# Patient Record
Sex: Female | Born: 1945 | Race: White | Hispanic: No | Marital: Married | Smoking: Never smoker
Health system: Southern US, Community
[De-identification: ages and names within clinical notes are randomized; demographics above are authoritative.]

## PROBLEM LIST (undated history)

## (undated) DIAGNOSIS — I10 Essential (primary) hypertension: Secondary | ICD-10-CM

## (undated) DIAGNOSIS — E039 Hypothyroidism, unspecified: Secondary | ICD-10-CM

## (undated) DIAGNOSIS — Z87442 Personal history of urinary calculi: Secondary | ICD-10-CM

## (undated) DIAGNOSIS — K219 Gastro-esophageal reflux disease without esophagitis: Secondary | ICD-10-CM

## (undated) DIAGNOSIS — I509 Heart failure, unspecified: Secondary | ICD-10-CM

## (undated) DIAGNOSIS — E785 Hyperlipidemia, unspecified: Secondary | ICD-10-CM

## (undated) DIAGNOSIS — I5022 Chronic systolic (congestive) heart failure: Secondary | ICD-10-CM

## (undated) DIAGNOSIS — Z8489 Family history of other specified conditions: Secondary | ICD-10-CM

## (undated) DIAGNOSIS — R569 Unspecified convulsions: Secondary | ICD-10-CM

## (undated) DIAGNOSIS — R0600 Dyspnea, unspecified: Secondary | ICD-10-CM

## (undated) DIAGNOSIS — M1990 Unspecified osteoarthritis, unspecified site: Secondary | ICD-10-CM

## (undated) HISTORY — PX: OTHER SURGICAL HISTORY: SHX169

## (undated) HISTORY — PX: CARDIAC SURGERY: SHX584

## (undated) HISTORY — DX: Hypothyroidism, unspecified: E03.9

## (undated) HISTORY — DX: Essential (primary) hypertension: I10

## (undated) HISTORY — DX: Hyperlipidemia, unspecified: E78.5

## (undated) HISTORY — DX: Chronic systolic (congestive) heart failure: I50.22

## (undated) HISTORY — PX: CHOLECYSTECTOMY: SHX55

---

## 2006-04-17 ENCOUNTER — Ambulatory Visit: Payer: Self-pay | Admitting: Urology

## 2006-07-31 ENCOUNTER — Ambulatory Visit: Payer: Self-pay | Admitting: Urology

## 2006-08-02 ENCOUNTER — Ambulatory Visit: Payer: Self-pay | Admitting: Urology

## 2006-08-10 ENCOUNTER — Ambulatory Visit: Payer: Self-pay | Admitting: Urology

## 2006-11-19 ENCOUNTER — Ambulatory Visit: Payer: Self-pay | Admitting: Urology

## 2007-08-28 ENCOUNTER — Ambulatory Visit: Payer: Self-pay | Admitting: Urology

## 2008-04-24 IMAGING — CR DG ABDOMEN 1V
1 series · 1 of 1 positions shown · non-contrast
Comparison: none

REASON FOR EXAM: renal calculi-lithotripsy
COMMENTS:

[view not recorded]
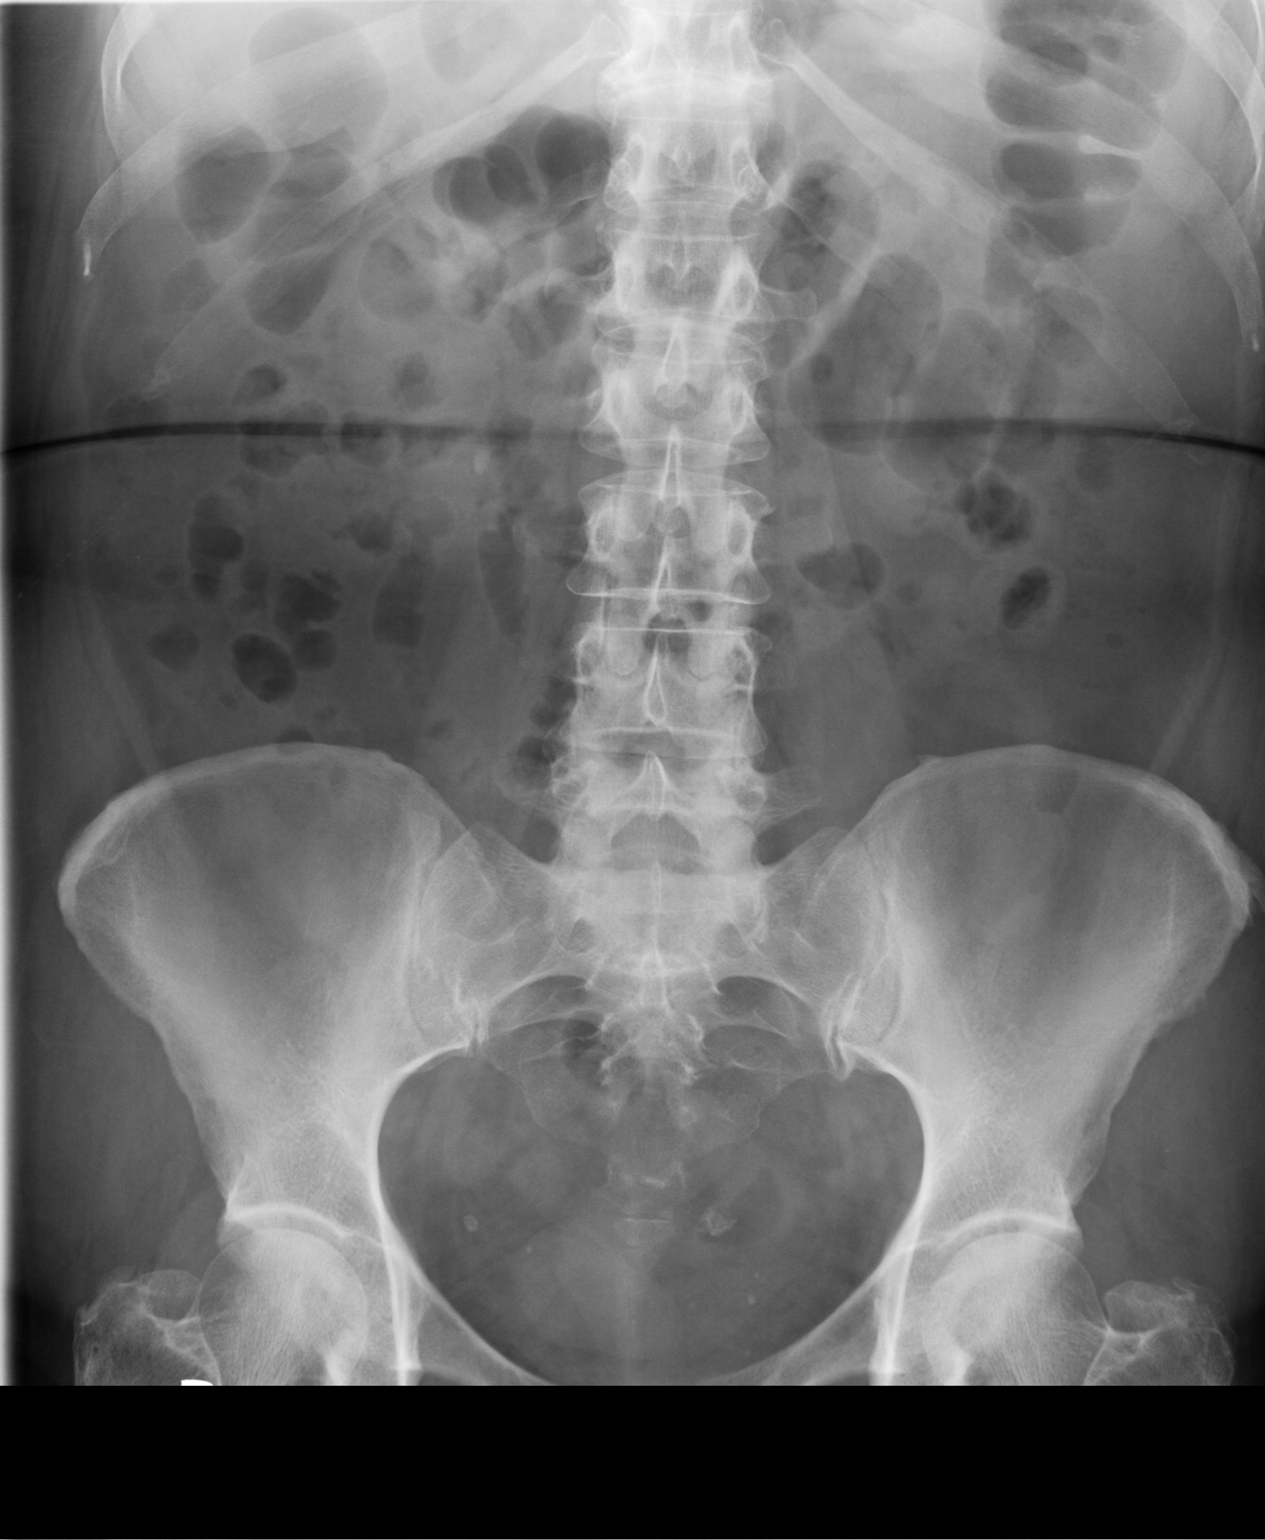

[1 of 1 positions shown; findings below may reference images not displayed]

PROCEDURE:     DXR - DXR KIDNEY URETER BLADDER  - August 02, 2006  [DATE]

RESULT:     Comparison is made to the study [DATE].

There is a calcification that projects in the right midabdomen between the
transverse processes of the second and third lumbar vertebra. This measures
approximately 8 mm in greatest dimension and is either a distal ureteral
stone or coarse phlebolith that is stable since yesterday's study just to
the LEFT of the coccyx. It measures approximately 8 mm in diameter.
IMPRESSION: See above.

## 2008-08-28 ENCOUNTER — Ambulatory Visit: Payer: Self-pay | Admitting: Urology

## 2009-09-01 ENCOUNTER — Ambulatory Visit: Payer: Self-pay | Admitting: Urology

## 2010-08-31 ENCOUNTER — Ambulatory Visit: Payer: Self-pay | Admitting: Urology

## 2012-10-04 ENCOUNTER — Other Ambulatory Visit (HOSPITAL_COMMUNITY): Payer: Self-pay | Admitting: *Deleted

## 2012-10-04 ENCOUNTER — Ambulatory Visit (HOSPITAL_COMMUNITY)
Admission: RE | Admit: 2012-10-04 | Discharge: 2012-10-04 | Disposition: A | Discharge status: Home / Self Care | Payer: Medicare Other | Source: Ambulatory Visit | Attending: Family Medicine | Admitting: Family Medicine

## 2012-10-04 DIAGNOSIS — R509 Fever, unspecified: Secondary | ICD-10-CM | POA: Insufficient documentation

## 2012-10-04 DIAGNOSIS — R059 Cough, unspecified: Secondary | ICD-10-CM

## 2012-10-04 DIAGNOSIS — R05 Cough: Secondary | ICD-10-CM

## 2012-11-19 ENCOUNTER — Ambulatory Visit: Payer: Self-pay | Admitting: Urology

## 2013-04-28 ENCOUNTER — Ambulatory Visit: Payer: Self-pay | Admitting: Gastroenterology

## 2013-04-29 LAB — PATHOLOGY REPORT

## 2013-05-01 ENCOUNTER — Observation Stay: Payer: Self-pay | Admitting: Internal Medicine

## 2013-05-01 LAB — CBC WITH DIFFERENTIAL/PLATELET
Basophil #: 0.1 10*3/uL (ref 0.0–0.1)
Basophil %: 1.2 %
Eosinophil #: 0.1 10*3/uL (ref 0.0–0.7)
Eosinophil %: 1.9 %
HCT: 38.6 % (ref 35.0–47.0)
HGB: 13.4 g/dL (ref 12.0–16.0)
Lymphocyte #: 1 10*3/uL (ref 1.0–3.6)
Lymphocyte %: 14.2 %
MCH: 34.6 pg — ABNORMAL HIGH (ref 26.0–34.0)
MCHC: 34.6 g/dL (ref 32.0–36.0)
MCV: 100 fL (ref 80–100)
Monocyte #: 0.7 x10 3/mm (ref 0.2–0.9)
Monocyte %: 11.1 %
Neutrophil #: 4.8 10*3/uL (ref 1.4–6.5)
Neutrophil %: 71.6 %
Platelet: 223 10*3/uL (ref 150–440)
RBC: 3.86 10*6/uL (ref 3.80–5.20)
RDW: 14.2 % (ref 11.5–14.5)
WBC: 6.7 10*3/uL (ref 3.6–11.0)

## 2013-05-01 LAB — COMPREHENSIVE METABOLIC PANEL
Albumin: 3.7 g/dL (ref 3.4–5.0)
Alkaline Phosphatase: 107 U/L (ref 50–136)
Anion Gap: 4 — ABNORMAL LOW (ref 7–16)
BUN: 18 mg/dL (ref 7–18)
Bilirubin,Total: 1.2 mg/dL — ABNORMAL HIGH (ref 0.2–1.0)
Calcium, Total: 9.5 mg/dL (ref 8.5–10.1)
Chloride: 107 mmol/L (ref 98–107)
Co2: 32 mmol/L (ref 21–32)
Creatinine: 0.91 mg/dL (ref 0.60–1.30)
EGFR (African American): >60
EGFR (Non-African Amer.): >60
Glucose: 128 mg/dL — ABNORMAL HIGH (ref 65–99)
Osmolality: 289 (ref 275–301)
Potassium: 4 mmol/L (ref 3.5–5.1)
SGOT(AST): 34 U/L (ref 15–37)
SGPT (ALT): 41 U/L (ref 12–78)
Sodium: 143 mmol/L (ref 136–145)
Total Protein: 6.7 g/dL (ref 6.4–8.2)

## 2013-05-01 LAB — PRO B NATRIURETIC PEPTIDE: B-Type Natriuretic Peptide: 6225 pg/mL — ABNORMAL HIGH (ref 0–125)

## 2013-05-01 LAB — TROPONIN I: Troponin-I: 0.05 ng/mL

## 2013-05-02 LAB — COMPREHENSIVE METABOLIC PANEL
Albumin: 3.6 g/dL (ref 3.4–5.0)
Alkaline Phosphatase: 99 U/L (ref 50–136)
Anion Gap: 5 — ABNORMAL LOW (ref 7–16)
BUN: 17 mg/dL (ref 7–18)
Bilirubin,Total: 1.1 mg/dL — ABNORMAL HIGH (ref 0.2–1.0)
Calcium, Total: 9.1 mg/dL (ref 8.5–10.1)
Chloride: 106 mmol/L (ref 98–107)
Co2: 33 mmol/L — ABNORMAL HIGH (ref 21–32)
Creatinine: 1.05 mg/dL (ref 0.60–1.30)
EGFR (African American): >60
EGFR (Non-African Amer.): 55 — ABNORMAL LOW
Glucose: 113 mg/dL — ABNORMAL HIGH (ref 65–99)
Osmolality: 289 (ref 275–301)
Potassium: 3.1 mmol/L — ABNORMAL LOW (ref 3.5–5.1)
SGOT(AST): 34 U/L (ref 15–37)
SGPT (ALT): 38 U/L (ref 12–78)
Sodium: 144 mmol/L (ref 136–145)
Total Protein: 6.3 g/dL — ABNORMAL LOW (ref 6.4–8.2)

## 2013-05-02 LAB — TSH: Thyroid Stimulating Horm: 4.46 u[IU]/mL

## 2013-05-02 LAB — TROPONIN I: Troponin-I: 0.05 ng/mL

## 2013-07-10 HISTORY — PX: PACEMAKER IMPLANT: EP1218

## 2013-08-13 ENCOUNTER — Ambulatory Visit: Payer: Self-pay | Admitting: Cardiology

## 2013-09-15 HISTORY — PX: MITRAL VALVE REPAIR: SHX2039

## 2013-10-22 ENCOUNTER — Encounter: Payer: Self-pay | Admitting: Cardiology

## 2013-11-07 ENCOUNTER — Encounter: Payer: Self-pay | Admitting: Cardiology

## 2013-12-08 ENCOUNTER — Encounter: Payer: Self-pay | Admitting: Cardiology

## 2014-01-29 ENCOUNTER — Ambulatory Visit: Payer: Self-pay | Admitting: Cardiovascular Disease

## 2014-01-29 LAB — URINALYSIS, COMPLETE
Bacteria: NONE SEEN
Bilirubin,UR: NEGATIVE
Blood: NEGATIVE
Glucose,UR: NEGATIVE mg/dL (ref 0–75)
Ketone: NEGATIVE
Nitrite: NEGATIVE
Ph: 5 (ref 4.5–8.0)
Protein: NEGATIVE
RBC,UR: 9 /HPF (ref 0–5)
Specific Gravity: 1.023 (ref 1.003–1.030)
Squamous Epithelial: 3
WBC UR: 2 /HPF (ref 0–5)

## 2014-01-29 LAB — BASIC METABOLIC PANEL
Anion Gap: 4 — ABNORMAL LOW (ref 7–16)
BUN: 17 mg/dL (ref 7–18)
Calcium, Total: 8.6 mg/dL (ref 8.5–10.1)
Chloride: 109 mmol/L — ABNORMAL HIGH (ref 98–107)
Co2: 30 mmol/L (ref 21–32)
Creatinine: 0.81 mg/dL (ref 0.60–1.30)
EGFR (African American): >60
EGFR (Non-African Amer.): >60
Glucose: 82 mg/dL (ref 65–99)
Osmolality: 286 (ref 275–301)
Potassium: 3.7 mmol/L (ref 3.5–5.1)
Sodium: 143 mmol/L (ref 136–145)

## 2014-01-29 LAB — CBC WITH DIFFERENTIAL/PLATELET
Basophil #: 0.1 10*3/uL (ref 0.0–0.1)
Basophil %: 1.7 %
Eosinophil #: 0.2 10*3/uL (ref 0.0–0.7)
Eosinophil %: 4.3 %
HCT: 39 % (ref 35.0–47.0)
HGB: 12.7 g/dL (ref 12.0–16.0)
Lymphocyte #: 1.1 10*3/uL (ref 1.0–3.6)
Lymphocyte %: 27 %
MCH: 32.1 pg (ref 26.0–34.0)
MCHC: 32.5 g/dL (ref 32.0–36.0)
MCV: 99 fL (ref 80–100)
Monocyte #: 0.5 x10 3/mm (ref 0.2–0.9)
Monocyte %: 12.1 %
Neutrophil #: 2.2 10*3/uL (ref 1.4–6.5)
Neutrophil %: 54.9 %
Platelet: 175 10*3/uL (ref 150–440)
RBC: 3.95 10*6/uL (ref 3.80–5.20)
RDW: 13.5 % (ref 11.5–14.5)
WBC: 4 10*3/uL (ref 3.6–11.0)

## 2014-01-29 LAB — APTT: Activated PTT: 28.7 secs (ref 23.6–35.9)

## 2014-01-29 LAB — PROTIME-INR
INR: 1
Prothrombin Time: 12.7 secs (ref 11.5–14.7)

## 2014-02-06 ENCOUNTER — Ambulatory Visit: Payer: Self-pay | Admitting: Cardiovascular Disease

## 2014-02-07 LAB — CBC WITH DIFFERENTIAL/PLATELET
Basophil #: 0 10*3/uL (ref 0.0–0.1)
Basophil %: 0.7 %
Eosinophil #: 0.2 10*3/uL (ref 0.0–0.7)
Eosinophil %: 4 %
HCT: 38.5 % (ref 35.0–47.0)
HGB: 12.9 g/dL (ref 12.0–16.0)
Lymphocyte #: 0.9 10*3/uL — ABNORMAL LOW (ref 1.0–3.6)
Lymphocyte %: 17.2 %
MCH: 32.8 pg (ref 26.0–34.0)
MCHC: 33.6 g/dL (ref 32.0–36.0)
MCV: 98 fL (ref 80–100)
Monocyte #: 0.6 x10 3/mm (ref 0.2–0.9)
Monocyte %: 11.5 %
Neutrophil #: 3.3 10*3/uL (ref 1.4–6.5)
Neutrophil %: 66.6 %
Platelet: 145 10*3/uL — ABNORMAL LOW (ref 150–440)
RBC: 3.93 10*6/uL (ref 3.80–5.20)
RDW: 13.5 % (ref 11.5–14.5)
WBC: 5 10*3/uL (ref 3.6–11.0)

## 2014-02-07 LAB — COMPREHENSIVE METABOLIC PANEL
Albumin: 3 g/dL — ABNORMAL LOW (ref 3.4–5.0)
Alkaline Phosphatase: 86 U/L
Anion Gap: 3 — ABNORMAL LOW (ref 7–16)
BUN: 14 mg/dL (ref 7–18)
Bilirubin,Total: 0.7 mg/dL (ref 0.2–1.0)
Calcium, Total: 8.8 mg/dL (ref 8.5–10.1)
Chloride: 106 mmol/L (ref 98–107)
Co2: 33 mmol/L — ABNORMAL HIGH (ref 21–32)
Creatinine: 0.89 mg/dL (ref 0.60–1.30)
EGFR (African American): >60
EGFR (Non-African Amer.): >60
Glucose: 91 mg/dL (ref 65–99)
Osmolality: 283 (ref 275–301)
Potassium: 4.3 mmol/L (ref 3.5–5.1)
SGOT(AST): 30 U/L (ref 15–37)
SGPT (ALT): 17 U/L
Sodium: 142 mmol/L (ref 136–145)
Total Protein: 6.2 g/dL — ABNORMAL LOW (ref 6.4–8.2)

## 2014-02-07 LAB — MAGNESIUM: Magnesium: 1.8 mg/dL

## 2014-10-30 NOTE — Discharge Summary (Signed)
PATIENT NAME:  Christy OctaveBREWER, Shatavia G MR#:  [id number] DATE OF BIRTH:  1946/01/05  DATE OF ADMISSION:  05/01/2013 DATE OF DISCHARGE:  05/02/2013  PRESENTING COMPLAINT: Shortness of breath and leg swelling.   DISCHARGE DIAGNOSES: 1.  Acute congestive heart failure, systolic, new onset.  2.  Hypertension.  3.  Obesity.   CONDITION ON DISCHARGE: Fair, vitals stable. Blood pressure is 117/84, pulse is 97, pulse ox is 95% on room air.   Echo Doppler showed EF of 20% to 25%, severely global decreased left ventricular systolic function, moderately increased left ventricular internal cavity size, mild to moderate tricuspid regurgitation, mild to moderate mitral valve regurgitation.   MEDICATIONS AT DISCHARGE: 1.  Allopurinol 100 mg p.o. daily.  2.  Aspirin 81 mg daily.  3.  Levoxyl 50 mcg p.o. daily.  4.  Lisinopril 30 mg daily.  5.  Lasix 20 mg 1 tablet once a day as needed for shortness of breath and leg swelling.  6.  Toprol-XL 50 mg daily.  7.  ProAir HFA 2 puffs 4 to 6 hourly as needed.   DIET: Low sodium diet.   FOLLOWUP:  1.  With Dr. Darrold JunkerParaschos in 2 to 4 weeks. The patient has an appointment for November 11th.   2.  Follow up with PCP, Dr. Ninfa LindenKathy Patterson.   LABORATORY AND RADIOLOGICAL DATA:   1.  Glucose is 113, BUN 17, creatinine 1.05, sodium is 144, potassium 3.1, chloride is 133.  2.  LFTs within normal limits. Albumin is 3.6.  3.  TSH is 4.46.  4.  Troponin is 0.05.  5.  Chest x-ray consistent with low-grade CHF.   6.  White count is 6.7.  7.  Troponin x 3 is negative. B-type natriuretic peptide is 6225.  8.  EKG sinus tach.   BRIEF SUMMARY OF HOSPITAL COURSE: Christy Stanton is a very pleasant 69 year old Caucasian female with past medical history of hypertension, hypothyroidism, comes in with progressive increasing shortness of breath along with orthopnea and lower extremity edema.  She had elevated BNP of more than 6000. She was admitted with:  1.  Acute congestive heart failure,  likely due to her long-standing history of hypertension. The patient has history of mitral regurgitation. She received 2 doses of Lasix and urine output was more than 1500 mL. She was clinically euvolemic. She was given Lasix to take it as needed at home. Echo showed EF of 20% to 25%. Cardiac enzymes remained negative. The patient has followup appointment with Dr. Darrold JunkerParaschos on November 11th which she is recommended to keep.   2.  Hyperglycemia. Sugars were stable.  3.  Hypertension. Continue lisinopril and Toprol.  4.  Hyperthyroidism. Synthroid was continued.  5.  Deep vein thrombosis prophylaxis with heparin.   The patient was advised lifestyle changes along with dietary restrictions of salt intake was recommended with the patient. Hospital stay otherwise remained stable. The patient remained a full code.   TIME SPENT: 40 minutes.   ____________________________ Wylie HailSona A. Allena KatzPatel, MD sap:cs D: 05/04/2013 09:51:52 ET T: 05/04/2013 20:44:23 ET JOB#: [id number]  cc: Meghna Hagmann A. Allena KatzPatel, MD, <Dictator> Marcina MillardAlexander Paraschos, MD Darcella GasmanKathy L. Jarold MottoPatterson, FNP-C Willow OraSONA A Kaysi Ourada MD ELECTRONICALLY SIGNED 05/15/2013 13:26

## 2014-10-30 NOTE — H&P (Signed)
PATIENT NAME:  Christy Stanton, BOTSFORD MR#:  [id number] DATE OF BIRTH:  1946-01-12  DATE OF ADMISSION:  05/01/2013  REFERRING PHYSICIAN: Dr. Corky Downs.   PRIMARY CARE PHYSICIAN: Dr. Sharlett Iles.   CHIEF COMPLAINT: Shortness of breath.   HISTORY OF PRESENT ILLNESS: Christy Stanton is a 69 year old Caucasian female with past medical history of hypertension and hypothyroidism and is presenting with shortness of breath. She originally had symptoms approximately 3 weeks ago which she described URI-like symptoms with nasal congestion, nasal purulence, and nonproductive cough. She was treated  with a course of azithromycin and her symptoms completely resolved; however, for the last 1 week, she has been experiencing gradually worsening dyspnea on exertion with associated 2- to 3-pillow orthopnea as well as a small amount of lower extremity edema.   She has also noticed a cough which is productive only in the a.m. of whitish sputum. The cough is persistent throughout the day, though most prominent in morning and night. Has no associated fevers, chills, sick contacts. She denies any chest pain or palpitations. She was evaluated by her PCP who felt that she had pneumonia, requested her to present to the Emergency Department.   Currently she is without complaint.   REVIEW OF SYSTEMS:  CONSTITUTIONAL: Denies any fevers, fatigue, weakness or pain.  EYES: Denies blurred vision, double vision, eye pain.  ENT: Denies ear pain, tinnitus or discharge.  RESPIRATORY: Cough as above. Dyspnea on exertion as above. Denies any wheezing or painful respirations.  CARDIOVASCULAR: Denies chest pain, palpitations. Positive for orthopnea, edema.  GASTROINTESTINAL: Denies nausea, vomiting, diarrhea, abdominal pain.  GENITOURINARY: Denies dysuria, hematuria.  ENDOCRINE: Denies nocturia or thyroid problems.  HEMOLYMPHATIC: Denies easy bruising or bleeding.  SKIN: Denies any rashes or lesions.  MUSCULOSKELETAL: Denies any pain in her neck,  back, shoulder knees, hip, or arthritic symptoms.  NEUROLOGIC: Denies any paralysis, paresthesias.  PSYCHIATRIC: Denies any anxiety or depressive symptoms.   Otherwise a full review of systems performed by me is negative.   PAST MEDICAL HISTORY: Hypertension, hypothyroidism, and nephrolithiasis.   FAMILY HISTORY: Multiple members of family with hyperlipidemia. Grandmother with hypertension. No coronary artery disease to her knowledge.   SOCIAL HISTORY: Denies any alcohol, tobacco or drug usage. Lives with her husband.   ALLERGIES: PENICILLIN.   HOME MEDICATIONS: Allopurinol 100 mg p.o. daily, aspirin 81 mg p.o. daily, Synthroid 50 mcg p.o. daily, lisinopril 30 mg p.o. daily, ProAir 90 mcg inhalation 2 puffs inhaled four to 6 hours as needed for shortness of breath, Toprol-XL 50 mg by mouth daily.   PHYSICAL EXAMINATION:  VITAL SIGNS: Temperature 98.6, heart rate 104, respirations 26, blood pressure 158/104, saturating 94% on room air. Weight 74.8 kg, BMI 30.2.  GENERAL: Well-nourished, obese Caucasian female who is currently in no acute distress.  HEAD: Normocephalic, atraumatic.  EYES: Pupils equal, reactive and round to light. Extraocular muscles are intact. No scleral icterus.  MOUTH: Moist mucosal membranes. Dentition intact. No abscess noted.  ENT: Throat clear without exudate. No external lesions.  NECK: Supple. No thyromegaly. No nodules appreciated. No JVD appreciated though somewhat difficult secondary to body habitus.  PULMONARY: Bibasilar crackles without wheezes or rhonchi. No use of accessory muscles. Good respiratory effort bilaterally. Diminished breath sounds at the right base.  CHEST: Nontender to palpation.  CARDIOVASCULAR: S1, S2, regular rate and rhythm, 2/6 systolic ejection murmur best heard at the left sternal border. She has trace pedal edema to the ankles bilaterally. Pedal pulses 2+ bilaterally.  GASTROINTESTINAL: Soft, nontender, nondistended, obese. No  masses.  No hepatosplenomegaly. Positive bowel sounds.  MUSCULOSKELETAL: No swelling, clubbing. Trace edema as mentioned above. Range of motion is full in all extremities.  NEUROLOGIC: Cranial nerves II through XII intact. No gross focal neurological deficits. Sensation intact. Reflexes intact.  SKIN: No ulcerations, lesions, rashes or cyanosis. Skin is warm and dry. Turgor is intact.  PSYCHIATRIC: Mood and affect are within normal limits. She is awake, alert and oriented x3. Insight and judgment are intact.   LABORATORY DATA: Sodium 143, potassium 4, chloride 107, bicarb 32, BUN 18, creatinine 0.91, glucose 128. BNP 6225. LFTs: Total protein 6.7, albumin 3.7, total bilirubin 1.2, alk  phos  107, AST 34, ALT 41. Troponin I 0.05. WBC 6.7, hemoglobin 13.4, platelets of 223.   EKG: Sinus tachycardia, heart rate of 102. No ST or T-wave abnormalities, though it meets minimal voltage criteria for likely left ventricular hypertrophy. Also has evidence of left atrial enlargement.   Chest x-ray revealing bilateral pleural effusions most prominent on the right.   ASSESSMENT AND PLAN: A 69 year old female with history of hypertension, hyperthyroidism presenting with shortness of breath which has been progressive over the last week with associated 3-pillow orthopnea and lower extremity edema.  1.  Acute congestive heart failure of unclear etiology. She does have a history of mitral regurgitation. Will order strict inputs and outputs, IV Lasix daily given she has already been given Lasix 40 mg once. Will check a TSH, transthoracic echocardiogram, and trend cardiac enzymes.  2.  Hyperbilirubinemia, slightly elevated without symptoms. Follow with repeat level.  3.  Hyperglycemia, insulin sliding scale q.6 hours.  4.  Hypertension. Continue lisinopril and Toprol.   5.  Hypothyroidism. Check TSH. Continue Synthroid.  6.  Deep vein thrombosis prophylaxis. Heparin subcutaneous.   CODE STATUS: FULL CODE.   TIME SPENT: 45  minutes.    ____________________________ Aaron Mose. Hower, MD dkh:np D: 05/01/2013 21:01:58 ET T: 05/01/2013 21:27:32 ET JOB#: [id number]  cc: Aaron Mose. Hower, MD, <Dictator> DAVID Woodfin Ganja MD ELECTRONICALLY SIGNED 05/02/2013 20:06

## 2014-10-31 NOTE — Op Note (Signed)
PATIENT NAME:  Christy Stanton, Christy Stanton MR#:  [id number] DATE OF BIRTH:  Mar 21, 1946  DATE OF PROCEDURE:  02/06/2014  Implanted cardioverter defibrillator implantation (Medtronic)-primary prevention.   PREPROCEDURE DIAGNOSES:  1. 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  2. History of atrioventricular block following mitral valve repair with subsequent resumption of atrioventricular conduction with a normal QRS duration and PR intervals.   POSTPROCEDURE DIAGNOSES: 1. 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  2. History of atrioventricular block following mitral valve repair with subsequent resumption of atrioventricular conduction with a normal QRS duration and PR intervals. 3. Status post left upper extremity venogram demonstrating central venous patency without focal stenosis or marked tortuosity to prevent lead placement.  4. Status post left-sided dual chamber single coil implantable cardioverter defibrillator implantation (Medtronic)-primary prevention.   PRIMARY OPERATOR: Brion Alimentonald D. Jae Skeet, M.D.   [facility] [place] [facility]   COMPLICATIONS:  None.   ESTIMATED BLOOD LOSS: Minimal.   SEDATION: Monitored anesthesia care.   LOCAL ANESTHETIC: 1% lidocaine 0.25% bupivacaine 30 mL.   FLUOROSCOPY TIME: Less than 5 minutes (see fluoroscopy report).   SEDATION MEDICATIONS: See anesthesia report.   INDICATIONS AND CONSENTS: Christy Stanton was evaluated in electrophysiology consultation clinic, following findings of a persistently depressed left ventricular ejection fraction despite symptomatic improvement status post mitral valve ring repair, on a stable and optimal medication regimen greater than 3 months, status post repair. LVEF equals 25%. Although the patient had AV block early after her procedure, she regained 1:1 AV conduction at baseline with PR and QRS intervals that were normal. When discussing primary prevention, ICD single-chamber versus dual-chamber ICD, decision to place a dual-chamber device based on history of AV block with mitral valve ring repair and risk of atrial arrhythmias in the setting of age equals 69 years old with history of severe mitral disease and nonischemic cardiomyopathy with congestive heart failure, EF equals 25%. Risks, benefits, alternatives were reviewed, and consent signed and placed on the chart in the clinic setting and scanned into the electronic medical record.   DESCRIPTION OF PROCEDURE: The patient was brought to the OR #2 in a fasting and nonsedated state. Timeout was performed. Appropriate resuscitative equipment was attached. The left infraclavicular region was prepped with chlorhexidine followed by ChloraPrep and draped usual sterile manner with Ioban on the surgical site.   An incision was made in the left infraclavicular region extending to the pre-pectoralis fascia. A subcutaneous pocket was fashioned inferiorly and medially to accommodate ICD hardware. A bacitracin-soaked radiopaque gauze was placed in the pocket. The central venous access was investigated using fluoroscopic landmarks alone; however,  secondary to low crossover point of the clavicle over the first rib, and the patient'Stanton elevated BMI, the decision was made to obtain a left upper extremity venogram with 10 mL of IV contrast cine'd  and used for a road map to guide IV access to the axillary vein as it crossed over the first rib. The J-tipped guidewire was passed below the level of the diaphragm further confirming venous position, and a second wire was placed using the same modified Seldinger technique with a J-tipped guidewire passed below the level of the diaphragm further confirming venous position.   Over the more inferior wire, a 9 [place] SafeSheath was passed. Dilator wire removed. The body sheath was aspirated and flushed and then used to deliver the Medtronic model 6935M lead to the inferior right atrium using a curved purple stylet. The tricuspid valve was crossed and the lead was passed into the right ventricular outflow tract. The stylet was exchanged for a gray stylet with a slight curve at the tip. The lead was withdrawn and advanced towards the apex with counterclockwise torque directing the lead tip towards the interventricular septum at the level of the apex. Fixation screw was deployed, adequate lead slack was applied and confirmed with deep inspiration. There was no stimulation of the diaphragm with 10 V of output. R waves were measured at 10.9 mV with slew rate 2.7 V/sec, impedance 935 ohms, threshold 0.7 V at 0.5 ms.   The lead was fixed to the pre-pectoralis fascia with 2 separate 0 silk ties and tested with gentle traction to confirm stability. Over the superior J-tip guidewire, a 7 [place] SafeSheath was advanced. Dilator wire removed, and the body sheath was aspirated and flushed, and then used to deliver the Medtronic model 5076 lead to the inferior right atrium using a blue J-stylet. The lead tip was positioned in the region of the anterolateral right atrium with fixation screw deployed. Adequate lead slack applied and  confirmed with deep inspiration. There was no stimulation of the diaphragm with 10 V of output. P waves were measured at 2.2 mV with slew rate 1.1 V/sec, impedance of 506 ohms, and threshold of 0.9 V at 0.5 ms. The lead was fixed to the pre-pectoralis fascia with 2 separate 0 silk ties and tested with gentle traction to confirm stability.   The bacitracin-soaked radiopaque gauze was removed from the pocket. The pocket was flushed with copious amounts of bacitracin. Hemostasis was good. The leads were connected to the ICD generator, Medtronic Evera XT DR with visual confirmation of pins completely through the pin block, set screws tightened, leads tested with gentle traction to confirm stability and reconfirmation of pins completely through the pin block. The leads were then carefully coiled around the posterior aspect of the device and placed in the pocket with the header facing medially. The incision was closed with running layers of 2-0 and 3-0 Vicryl followed by 4-0 V-lock. Steri-Strips and a sterile dressing were applied. A pressure dressing was applied.   SUMMARY OF IMPLANTED HARDWARE: ICD generator is a Medtronic Evera XT DR, model K1472076, serial number [id number] with right ventricular lead model 6935M-55 cm, serial number [id number], and right atrial lead Medtronic model 5076-45 cm, serial number [id number].   SUMMARY OF PROGRAM PARAMETERS: Tachy therapy is programmed as a single zone device detecting ventricular fibrillation for rates greater than 200 beats per minute, 30 out of 40 for detection treated with ATP during charging followed by 35 joules shocks x 6. Brady pacing is AAI with DDD back-up, lower rate of 60, maximum tracking rate equals 120.   NOTATION: The patient had AV Wenckebach when paced under sedation at 90 beats per minute. With increasing this rate to 100 beats  per minute, there was 2:1 AV conduction. For this reason, rate response was not enabled on this device to avoid creating  long shorts with managed ventricular pacing and/or avoid any unnecessary ventricular pacing.   RECOMMENDATIONS: Bedrest x 4 hours. Telemetry overnight. Chest x-ray, PA and lateral in the morning. Pending wound check in the a.m. Christy Stanton will be discharged home with request to follow up with primary care physician for a wound check in 2 weeks, and follow with Dr. Darrold Junker for device follow-up q.3-4 months thereafter. Both Dr. Darrold Junker and Christy Stanton have our office contact information, if we should be of any assistance in the future.   ____________________________ Brion Aliment Ramces Shomaker, MD ddh:jr D: 02/06/2014 15:06:06 ET T: 02/06/2014 16:42:31 ET JOB#: [id number]  cc: Brion Aliment. Denetria Luevanos, MD, <Dictator> Marcina Millard, MD [facility] Verne Grain MD ELECTRONICALLY SIGNED 02/20/2014 15:07

## 2014-10-31 NOTE — Discharge Summary (Signed)
PATIENT NAME:  Christy OxfordBREWER, Vianca S MR#:  [id number] DATE OF BIRTH:  [birth year]  DATE OF ADMISSION:  02/06/2014 DATE OF DISCHARGE:  02/07/2014  DISCHARGE DIAGNOSES: 1.  Severe dilated cardiomyopathy.  2.  Hypertension.  3.  Hyperlipidemia.  4.  Coronary artery disease.   PROCEDURES: Patient underwent a defibrillator placement without complication as note is seen.   HISTORY OF PRESENT ILLNESS: This is a 69 year old female with known severe dilated cardiomyopathy on appropriate medications with congestive heart failure, stable at this time, who received a defibrillator placement without complication. The patient had done fairly well and was ambulating well without significant symptoms and was discharged to home in good condition.   DISCHARGE MEDICATIONS: Medication management includes allopurinol 100 mg each day, carvedilol 6.25 mg b.i.d., levothyroxine 0.05 mg each day, losartan 25 mg each day, nitrofurantoin 50 mg each evening, and simvastatin 20 mg each day.   DISCHARGE INSTRUCTIONS:  She is to follow up in 2 weeks for further evaluation and treatment options.    ____________________________ Lamar BlinksBruce J. Marlena Barbato, MD bjk:ts D: 02/07/2014 07:33:53 ET T: 02/07/2014 11:08:24 ET JOB#: [id number]  cc: Lamar BlinksBruce J. Jalaiya Oyster, MD, <Dictator> Lamar BlinksBRUCE J Wasil Wolke MD ELECTRONICALLY SIGNED 02/18/2014 10:16

## 2015-10-22 IMAGING — CR DG CHEST 2V
1 series · 2 of 2 positions shown · non-contrast
Comparison: 05/01/2013

CLINICAL DATA: Preop

EXAM:
CHEST  2 VIEW

[Series 1: w chest pa · 0.14mm/px · 2 of 2 slices shown]
[im 1/2]
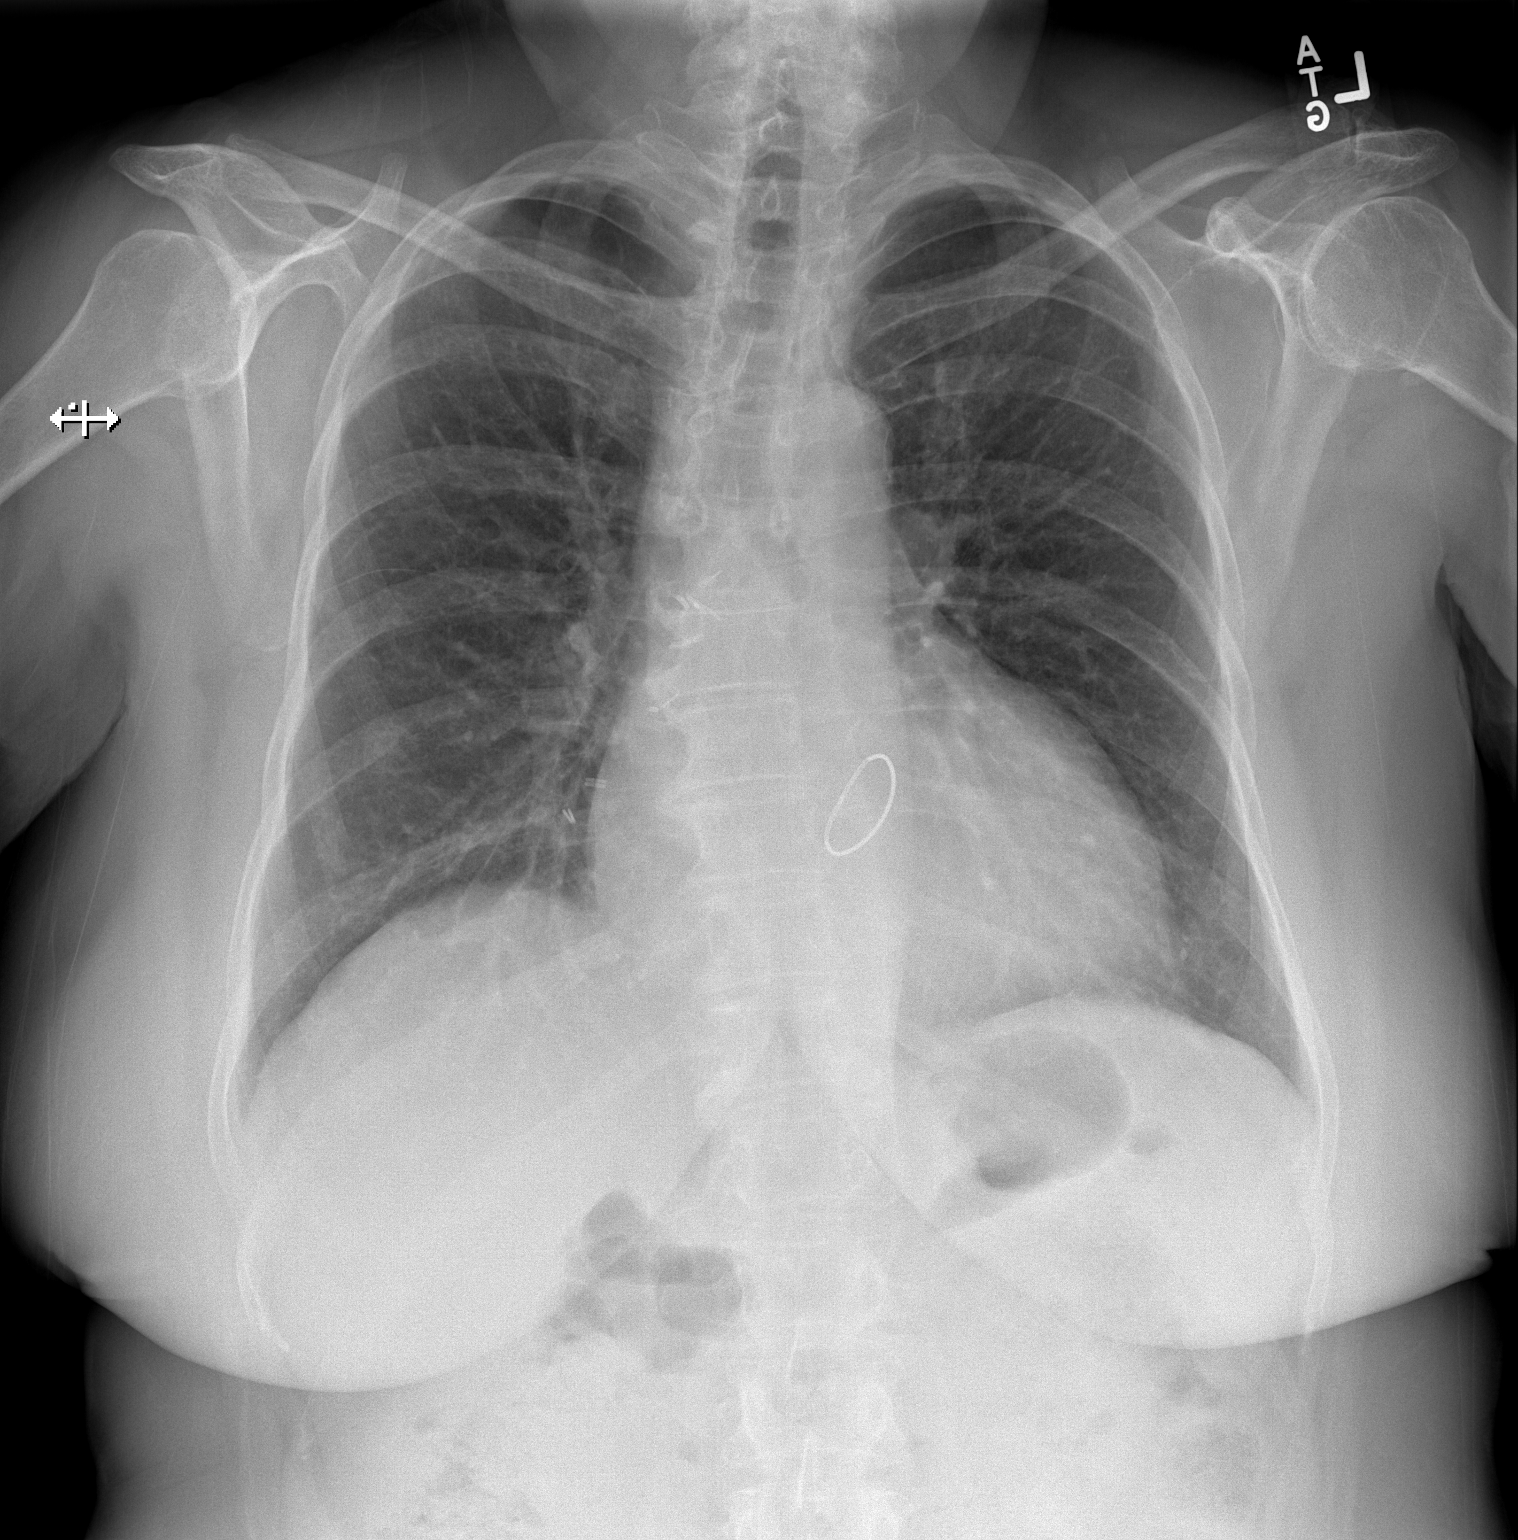
[im 2/2]
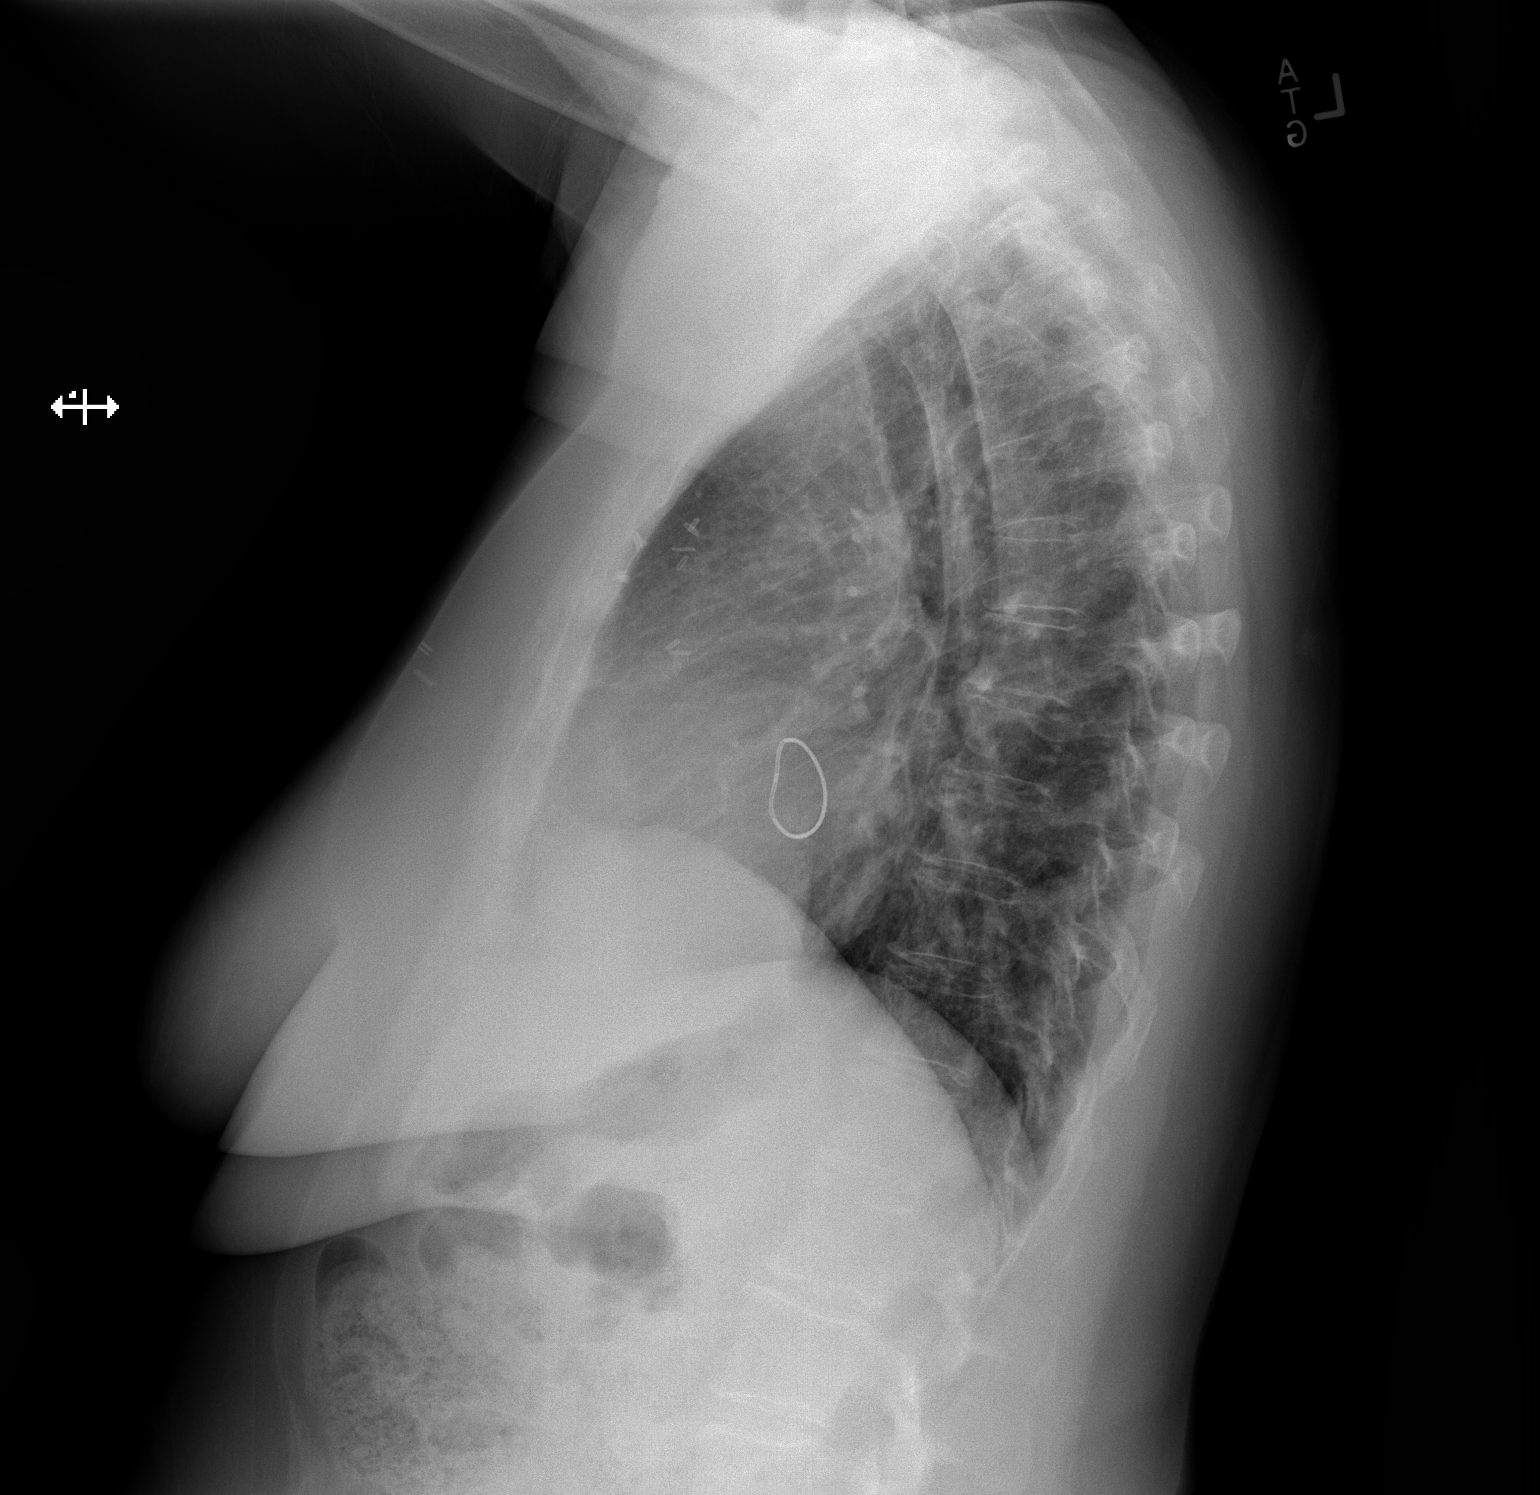

[2 of 2 positions shown; findings below may reference images not displayed]

FINDINGS: Cardiomediastinal silhouette is stable. Cardiomegaly is noted. The
patient is status post cardiac valve replacement. Mild degenerative
changes thoracic spine. Mild thoracic dextroscoliosis. No acute
infiltrate or pulmonary edema.
IMPRESSION: No active disease.  Cardiomegaly.

## 2019-05-20 ENCOUNTER — Ambulatory Visit: Payer: Self-pay | Admitting: Urology

## 2019-06-04 ENCOUNTER — Ambulatory Visit: Payer: Self-pay | Admitting: Urology

## 2019-06-18 ENCOUNTER — Other Ambulatory Visit: Payer: Self-pay

## 2019-06-18 ENCOUNTER — Ambulatory Visit: Payer: Medicare Other | Admitting: Urology

## 2019-06-18 ENCOUNTER — Encounter: Payer: Self-pay | Admitting: Urology

## 2019-06-18 VITALS — BP 150/78 | HR 72 | Ht 62.0 in | Wt 174.0 lb

## 2019-06-18 DIAGNOSIS — Z87442 Personal history of urinary calculi: Secondary | ICD-10-CM

## 2019-06-18 NOTE — Progress Notes (Signed)
06/18/2019 5:40 PM   Christy Stanton [birth year] [id number]  Referring provider: No referring provider defined for this encounter.  Chief Complaint  Patient presents with  . Other    HPI: 73 year old female with a personal history of nephrolithiasis who presents today to establish care.  She is previously followed by Dr. Jacqlyn Larsen.  She has a personal history of uric acid stones.  She is being prescribed allopurinol by Dr. Jacqlyn Larsen.  She previously declined 24 hour ruine.    Most recent imaging in the form of KUB 2014 shows no stone burden.    No recent flank pain.    PMH: Past Medical History:  Diagnosis Date  . Chronic systolic heart failure ([place])   . Hyperlipidemia   . Hypertension   . Hypothyroidism     Surgical History: Past Surgical History:  Procedure Laterality Date  . CARDIAC SURGERY    . MITRAL VALVE REPAIR    . TRANSESOPHAGEAL ECHOCARDIOGRAPHY      Home Medications:  Allergies as of 06/18/2019      Reactions   Erythromycin Base Nausea And Vomiting   Other reaction(s): OTHER Other reaction(s): Unknown Other reaction(s): OTHER Other reaction(s): OTHER Pt states she can take this medication, but causes severe GI upset   Penicillins Nausea And Vomiting, Other (See Comments)   Other reaction(s): RASH Childhood allergy. Skin reaction. No anaphylaxis. Other reaction(s): RASH Other reaction(s): RASH Childhood allergy. Skin reaction. No anaphylaxis. Other reaction(s): RASH Childhood allergy. Skin reaction. No anaphylaxis. Other reaction(s): RASH      Medication List       Accurate as of June 18, 2019 11:59 PM. If you have any questions, ask your nurse or doctor.        allopurinol 100 MG tablet Commonly known as: ZYLOPRIM Take by mouth.   aspirin EC 81 MG tablet Take by mouth.   carvedilol 12.5 MG tablet Commonly known as: COREG Take 12.5 mg by mouth 2 (two) times daily.   furosemide 20 MG tablet Commonly known as: LASIX once daily as  needed.   levothyroxine 50 MCG tablet Commonly known as: SYNTHROID Take by mouth.   lisinopril 20 MG tablet Commonly known as: ZESTRIL Take by mouth.   losartan 25 MG tablet Commonly known as: COZAAR Take 25 mg by mouth daily.   metoprolol tartrate 25 MG tablet Commonly known as: LOPRESSOR Take by mouth.   Omega-3 1000 MG Caps Take by mouth.   potassium chloride 10 MEQ tablet Commonly known as: KLOR-CON Take by mouth.   simvastatin 20 MG tablet Commonly known as: ZOCOR TAKE 1 TABLET BY MOUTH AT BEDTIME FOR CHOLESTEROL       Allergies:  Allergies  Allergen Reactions  . Erythromycin Base Nausea And Vomiting    Other reaction(s): OTHER Other reaction(s): Unknown Other reaction(s): OTHER Other reaction(s): OTHER Pt states she can take this medication, but causes severe GI upset   . Penicillins Nausea And Vomiting and Other (See Comments)    Other reaction(s): RASH Childhood allergy. Skin reaction. No anaphylaxis. Other reaction(s): RASH Other reaction(s): RASH Childhood allergy. Skin reaction. No anaphylaxis. Other reaction(s): RASH Childhood allergy. Skin reaction. No anaphylaxis. Other reaction(s): RASH     Family History: History reviewed. No pertinent family history.  Social History:  reports that she has never smoked. She has never used smokeless tobacco. She reports that she does not drink alcohol. No history on file for drug.  ROS: UROLOGY Frequent Urination?: No Hard to postpone urination?: Yes Burning/pain with urination?:  No Get up at night to urinate?: No Leakage of urine?: Yes Urine stream starts and stops?: No Trouble starting stream?: No Do you have to strain to urinate?: No Blood in urine?: No Urinary tract infection?: No Sexually transmitted disease?: No Injury to kidneys or bladder?: No Painful intercourse?: No Weak stream?: No Currently pregnant?: No Vaginal bleeding?: No Last menstrual period?: n  Gastrointestinal Nausea?:  No Vomiting?: No Indigestion/heartburn?: No Diarrhea?: No Constipation?: No  Constitutional Fever: No Night sweats?: No Weight loss?: No Fatigue?: No  Skin Skin rash/lesions?: No Itching?: No  Eyes Blurred vision?: No Double vision?: No  Ears/Nose/Throat Sore throat?: No Sinus problems?: No  Hematologic/Lymphatic Swollen glands?: No Easy bruising?: No  Cardiovascular Leg swelling?: No Chest pain?: No  Respiratory Cough?: No Shortness of breath?: No  Endocrine Excessive thirst?: No  Musculoskeletal Back pain?: No Joint pain?: No  Neurological Headaches?: No Dizziness?: No  Psychologic Depression?: No Anxiety?: No  Physical Exam: BP (!) 150/78   Pulse 72   Ht 5\' 2"  (1.575 m)   Wt 174 lb (78.9 kg)   BMI 31.83 kg/m   Constitutional:  Alert and oriented, No acute distress. HEENT: [place] AT, moist mucus membranes.  Trachea midline, no masses. Cardiovascular: No clubbing, cyanosis, or edema. Respiratory: Normal respiratory effort, no increased work of breathing. GI: Abdomen is soft, nontender, nondistended, no abdominal masses GU: No CVA tenderness Skin: No rashes, bruises or suspicious lesions. Neurologic: Grossly intact, no focal deficits, moving all 4 extremities. Psychiatric: Normal mood and affect.  Laboratory Data: Lab Results  Component Value Date   WBC 5.0 02/07/2014   HGB 12.9 02/07/2014   HCT 38.5 02/07/2014   MCV 98 02/07/2014   PLT 145 (L) 02/07/2014    Lab Results  Component Value Date   CREATININE 0.89 02/07/2014     Pertinent Imaging: No new imaging  Assessment & Plan:    1. History of kidney stones Currently completely asymptomatic without any interval stone episodes in many years  Last imaging in 2015 without significant stone burden  At this point in time, I see no need for further imaging unless she becomes symptomatic.  She primarily presents today to have a established urologist in case she has issues in the  future.  All questions were answered today.  Continue encourage adequate hydration as tolerated.  F/u prn  2016, MD  [facility] [address] [place], [place] [phone]

## 2019-11-27 ENCOUNTER — Encounter: Payer: Self-pay | Admitting: Urology

## 2020-09-13 ENCOUNTER — Emergency Department: Payer: Medicare PPO

## 2020-09-13 ENCOUNTER — Other Ambulatory Visit: Payer: Self-pay

## 2020-09-13 ENCOUNTER — Other Ambulatory Visit
Admission: RE | Admit: 2020-09-13 | Discharge: 2020-09-13 | Disposition: A | Discharge status: Home / Self Care | Payer: Medicare PPO | Source: Ambulatory Visit | Attending: *Deleted | Admitting: *Deleted

## 2020-09-13 ENCOUNTER — Encounter: Payer: Self-pay | Admitting: Emergency Medicine

## 2020-09-13 ENCOUNTER — Emergency Department
Admission: EM | Admit: 2020-09-13 | Discharge: 2020-09-13 | Disposition: A | Discharge status: Home / Self Care | Payer: Medicare PPO | Attending: Student in an Organized Health Care Education/Training Program | Admitting: Student in an Organized Health Care Education/Training Program

## 2020-09-13 DIAGNOSIS — R197 Diarrhea, unspecified: Secondary | ICD-10-CM | POA: Diagnosis not present

## 2020-09-13 DIAGNOSIS — R1011 Right upper quadrant pain: Secondary | ICD-10-CM | POA: Insufficient documentation

## 2020-09-13 DIAGNOSIS — Z79899 Other long term (current) drug therapy: Secondary | ICD-10-CM | POA: Insufficient documentation

## 2020-09-13 DIAGNOSIS — K8020 Calculus of gallbladder without cholecystitis without obstruction: Secondary | ICD-10-CM | POA: Diagnosis not present

## 2020-09-13 DIAGNOSIS — E039 Hypothyroidism, unspecified: Secondary | ICD-10-CM | POA: Insufficient documentation

## 2020-09-13 DIAGNOSIS — Z7982 Long term (current) use of aspirin: Secondary | ICD-10-CM | POA: Insufficient documentation

## 2020-09-13 DIAGNOSIS — I5022 Chronic systolic (congestive) heart failure: Secondary | ICD-10-CM | POA: Insufficient documentation

## 2020-09-13 DIAGNOSIS — I110 Hypertensive heart disease with heart failure: Secondary | ICD-10-CM | POA: Insufficient documentation

## 2020-09-13 LAB — COMPREHENSIVE METABOLIC PANEL
ALT: 20 U/L (ref 0–44)
AST: 27 U/L (ref 15–41)
Albumin: 3.9 g/dL (ref 3.5–5.0)
Alkaline Phosphatase: 74 U/L (ref 38–126)
Anion gap: 13 (ref 5–15)
BUN: 20 mg/dL (ref 8–23)
CO2: 26 mmol/L (ref 22–32)
Calcium: 9.4 mg/dL (ref 8.9–10.3)
Chloride: 102 mmol/L (ref 98–111)
Creatinine, Ser: 0.9 mg/dL (ref 0.44–1.00)
GFR, Estimated: >60 mL/min (ref >60)
Glucose, Bld: 79 mg/dL (ref 70–99)
Potassium: 4.1 mmol/L (ref 3.5–5.1)
Sodium: 141 mmol/L (ref 135–145)
Total Bilirubin: 1.6 mg/dL — ABNORMAL HIGH (ref 0.3–1.2)
Total Protein: 7.1 g/dL (ref 6.5–8.1)

## 2020-09-13 LAB — CBC
HCT: 41.1 % (ref 36.0–46.0)
Hemoglobin: 14.3 g/dL (ref 12.0–15.0)
MCH: 33.7 pg (ref 26.0–34.0)
MCHC: 34.8 g/dL (ref 30.0–36.0)
MCV: 96.9 fL (ref 80.0–100.0)
Platelets: 191 10*3/uL (ref 150–400)
RBC: 4.24 MIL/uL (ref 3.87–5.11)
RDW: 11.9 % (ref 11.5–15.5)
WBC: 6.5 10*3/uL (ref 4.0–10.5)
nRBC: 0 % (ref 0.0–0.2)

## 2020-09-13 LAB — TROPONIN I (HIGH SENSITIVITY)
Troponin I (High Sensitivity): 18 ng/L — ABNORMAL HIGH (ref <18)
Troponin I (High Sensitivity): 17 ng/L (ref <18)

## 2020-09-13 LAB — LIPASE, BLOOD: Lipase: 34 U/L (ref 11–51)

## 2020-09-13 MED ORDER — NITROFURANTOIN MONOHYD MACRO 100 MG PO CAPS: 100.0000 mg | ORAL_CAPSULE | Freq: Two times a day (BID) | ORAL | 0 refills | Status: DC

## 2020-09-13 NOTE — ED Triage Notes (Signed)
C/O RUQ abdominal pain, N/V/D over the weekend.  Seen through [facility] walk-in and was sent to ED for evaluation for troponin of 18.  AAOx3.  Skin warm and dry.  Only complaint is for slight discomfort to RUQ abdomen.

## 2020-09-13 NOTE — ED Provider Notes (Addendum)
[facility] Emergency Department Provider Note    Event Date/Time   First MD Initiated Contact with Patient 09/13/20 1523     (approximate)  I have reviewed the triage vital signs and the nursing notes.   HISTORY  Chief Complaint Abnormal Lab    HPI Christy Stanton is a 75 y.o. female   with the below listed past medical history presents to the ER for evaluation of right upper quadrant pain.  States that she had nausea vomiting diarrhea over the weekend.  Denies any shortness of breath or cough.  Has had some chills but no measured fevers.  No recent antibiotics.  Denies any pain at this time.  Does still have her gallbladder.   Past Medical History:  Diagnosis Date  . Chronic systolic heart failure ([facility])   . Hyperlipidemia   . Hypertension   . Hypothyroidism    No family history on file. Past Surgical History:  Procedure Laterality Date  . CARDIAC SURGERY    . MITRAL VALVE REPAIR    . TRANSESOPHAGEAL ECHOCARDIOGRAPHY     There are no problems to display for this patient.     Prior to Admission medications   Medication Sig Start Date End Date Taking? Authorizing Provider  nitrofurantoin, macrocrystal-monohydrate, (MACROBID) 100 MG capsule Take 1 capsule (100 mg total) by mouth 2 (two) times daily for 3 days. 09/13/20 09/16/20 Yes Willy Eddy, MD  allopurinol (ZYLOPRIM) 100 MG tablet Take by mouth. 07/17/13   [provider]  aspirin EC 81 MG tablet Take by mouth. 08/21/12   [provider]  carvedilol (COREG) 12.5 MG tablet Take 12.5 mg by mouth 2 (two) times daily. 06/11/19   [provider]  furosemide (LASIX) 20 MG tablet once daily as needed. 01/27/14   [provider]  levothyroxine (SYNTHROID) 50 MCG tablet Take by mouth. 08/21/12   [provider]  lisinopril (ZESTRIL) 20 MG tablet Take by mouth.    [provider]  losartan (COZAAR) 25 MG tablet Take 25 mg by mouth daily. 03/19/19    [provider]  metoprolol tartrate (LOPRESSOR) 25 MG tablet Take by mouth.    [provider]  Omega-3 1000 MG CAPS Take by mouth.    [provider]  potassium chloride (KLOR-CON) 10 MEQ tablet Take by mouth. 11/15/17   [provider]  simvastatin (ZOCOR) 20 MG tablet TAKE 1 TABLET BY MOUTH AT BEDTIME FOR CHOLESTEROL 11/28/13   [provider]    Allergies Erythromycin base and Penicillins    Social History Social History   Tobacco Use  . Smoking status: Never Smoker  . Smokeless tobacco: Never Used  Substance Use Topics  . Alcohol use: Never    Review of Systems Patient denies headaches, rhinorrhea, blurry vision, numbness, shortness of breath, chest pain, edema, cough, abdominal pain, nausea, vomiting, diarrhea, dysuria, fevers, rashes or hallucinations unless otherwise stated above in HPI. ____________________________________________   PHYSICAL EXAM:  VITAL SIGNS: Vitals:   09/13/20 1240  BP: 133/83  Pulse: 98  Resp: 16  Temp: 98.5 F (36.9 C)  SpO2: 98%    Constitutional: Alert and oriented.  Eyes: Conjunctivae are normal.  Head: Atraumatic. Nose: No congestion/rhinnorhea. Mouth/Throat: Mucous membranes are moist.   Neck: No stridor. Painless ROM.  Cardiovascular: Normal rate, regular rhythm. Grossly normal heart sounds.  Good peripheral circulation. Respiratory: Normal respiratory effort.  No retractions. Lungs CTAB. Gastrointestinal: Soft with mild ttp in RUQ, no rebound or guarding. No  distention. No abdominal bruits. No CVA tenderness. Genitourinary:  Musculoskeletal: No lower extremity tenderness nor edema.  No joint effusions. Neurologic:  Normal speech and language. No gross focal neurologic deficits are appreciated. No facial droop Skin:  Skin is warm, dry and intact. No rash noted. Psychiatric: Mood and affect are normal. Speech and behavior are normal.  ____________________________________________    LABS (all labs ordered are listed, but only abnormal results are displayed)  Results for orders placed or performed during the hospital encounter of 09/13/20 (from the past 24 hour(s))  Lipase, blood     Status: None   Collection Time: 09/13/20 12:46 PM  Result Value Ref Range   Lipase 34 11 - 51 U/L  Comprehensive metabolic panel     Status: Abnormal   Collection Time: 09/13/20 12:46 PM  Result Value Ref Range   Sodium 141 135 - 145 mmol/L   Potassium 4.1 3.5 - 5.1 mmol/L   Chloride 102 98 - 111 mmol/L   CO2 26 22 - 32 mmol/L   Glucose, Bld 79 70 - 99 mg/dL   BUN 20 8 - 23 mg/dL   Creatinine, Ser 9.48 0.44 - 1.00 mg/dL   Calcium 9.4 8.9 - 54.6 mg/dL   Total Protein 7.1 6.5 - 8.1 g/dL   Albumin 3.9 3.5 - 5.0 g/dL   AST 27 15 - 41 U/L   ALT 20 0 - 44 U/L   Alkaline Phosphatase 74 38 - 126 U/L   Total Bilirubin 1.6 (H) 0.3 - 1.2 mg/dL   GFR, Estimated >27 >03 mL/min   Anion gap 13 5 - 15  CBC     Status: None   Collection Time: 09/13/20 12:46 PM  Result Value Ref Range   WBC 6.5 4.0 - 10.5 K/uL   RBC 4.24 3.87 - 5.11 MIL/uL   Hemoglobin 14.3 12.0 - 15.0 g/dL   HCT 50.0 93.8 - 18.2 %   MCV 96.9 80.0 - 100.0 fL   MCH 33.7 26.0 - 34.0 pg   MCHC 34.8 30.0 - 36.0 g/dL   RDW 99.3 71.6 - 96.7 %   Platelets 191 150 - 400 K/uL   nRBC 0.0 0.0 - 0.2 %  Troponin I (High Sensitivity)     Status: None   Collection Time: 09/13/20 12:46 PM  Result Value Ref Range   Troponin I (High Sensitivity) 17 <18 ng/L   ____________________________________________  EKG My review and personal interpretation at Time: 15:43   Indication: ruq pain  Rate: 90  Rhythm: sinus Axis: normal Other: nonspecific st abn, unchanged from previous ekg 11:38 ____________________________________________  RADIOLOGY  I personally reviewed all radiographic images ordered to evaluate for the above acute complaints and reviewed radiology reports and findings.  These findings were personally discussed with the  patient.  Please see medical record for radiology report.  ____________________________________________   PROCEDURES  Procedure(s) performed:  Procedures    Critical Care performed: no ____________________________________________   INITIAL IMPRESSION / ASSESSMENT AND PLAN / ED COURSE  Pertinent labs & imaging results that were available during my care of the patient were reviewed by me and considered in my medical decision making (see chart for details).   DDX: Cholelithiasis, cholecystitis, cholangitis, pneumonia, pleurisy, doubt ACS or CHF exacerbation.  Christy Stanton is a 75 y.o. who presents to the ED with agitation as described above.  Patient nontoxic-appearing currently pain-free.  Troponin clinic earlier was only mildly elevated.  Repeat here is 17.  Repeat EKG was and consistent  with previous but appears that there is a limb later is reversible.  She not complaining of any chest pain or pressure.  Will repeat EKG and compare to clinic EKG.  Have a higher suspicion for biliary pathology.  Her white count is normal.  T bili 1.6.  She does not have any guarding or rebound she is not febrile right now.  Her ultrasound does show evidence of cholelithiasis but no evidence of cholecystitis.  She is pain-free with reassuring work-up will give referral to outpatient follow-up.  We discussed strict return precautions.  Patient agreeable to plan.  While going over discharge instructions patient mention that walk-in clinic had noted that she had many bacteria in her urine.  Prescription antibiotic sent to her pharmacy.    The patient was evaluated in Emergency Department today for the symptoms described in the history of present illness. He/she was evaluated in the context of the global COVID-19 pandemic, which necessitated consideration that the patient might be at risk for infection with the SARS-CoV-2 virus that causes COVID-19. Institutional protocols and algorithms that pertain to the  evaluation of patients at risk for COVID-19 are in a state of rapid change based on information released by regulatory bodies including the CDC and federal and state organizations. These policies and algorithms were followed during the patient's care in the ED.  As part of my medical decision making, I reviewed the following data within the electronic MEDICAL RECORD NUMBER Nursing notes reviewed and incorporated, Labs reviewed, notes from prior ED visits and [place] Controlled Substance Database   ____________________________________________   FINAL CLINICAL IMPRESSION(S) / ED DIAGNOSES  Final diagnoses:  RUQ abdominal pain  Calculus of gallbladder without cholecystitis without obstruction      NEW MEDICATIONS STARTED DURING THIS VISIT:  New Prescriptions   NITROFURANTOIN, MACROCRYSTAL-MONOHYDRATE, (MACROBID) 100 MG CAPSULE    Take 1 capsule (100 mg total) by mouth 2 (two) times daily for 3 days.     Note:  This document was prepared using Dragon voice recognition software and may include unintentional dictation errors.    Willy Eddy, MD 09/13/20 1724    Willy Eddy, MD 09/13/20 [phone]

## 2020-09-13 NOTE — ED Notes (Signed)
Pt has riuq pain with vomiting and diarrhea.  Sx over the weekend.  Saw doctor at urgent care today and sent to er for eval of abd pain.  Pt denies chest pain or sob.  Pt alert  Family with pt

## 2020-09-14 ENCOUNTER — Other Ambulatory Visit: Payer: Self-pay

## 2020-09-14 ENCOUNTER — Encounter: Payer: Self-pay | Admitting: Surgery

## 2020-09-14 ENCOUNTER — Ambulatory Visit (INDEPENDENT_AMBULATORY_CARE_PROVIDER_SITE_OTHER): Payer: Medicare PPO | Admitting: Surgery

## 2020-09-14 VITALS — BP 126/84 | HR 78 | Temp 98.3°F | Ht 62.0 in | Wt 169.0 lb

## 2020-09-14 DIAGNOSIS — K8010 Calculus of gallbladder with chronic cholecystitis without obstruction: Secondary | ICD-10-CM | POA: Diagnosis not present

## 2020-09-14 NOTE — Progress Notes (Signed)
A request for Cardiology clearance has been faxed to Dr Jasmine December office at [facility].

## 2020-09-14 NOTE — Patient Instructions (Signed)
You have requested to have your gallbladder removed. This will be done at [facility] with Dr. Claudine Mouton.  You will most likely be out of work 1-2 weeks for this surgery. You will return after your post-op appointment with a lifting restriction for approximately 4 more weeks.  You will be able to eat anything you would like to following surgery. But, start by eating a bland diet and advance this as tolerated. The Gallbladder diet is below, please go as closely by this diet as possible prior to surgery to avoid any further attacks.  Please see the (blue)pre-care form that you have been given today. Our surgery scheduler will call you to look at surgery dates and to go over information.  If you have any questions, please call our office.  Laparoscopic Cholecystectomy Laparoscopic cholecystectomy is surgery to remove the gallbladder. The gallbladder is located in the upper right part of the abdomen, behind the liver. It is a storage sac for bile, which is produced in the liver. Bile aids in the digestion and absorption of fats. Cholecystectomy is often done for inflammation of the gallbladder (cholecystitis). This condition is usually caused by a buildup of gallstones (cholelithiasis) in the gallbladder. Gallstones can block the flow of bile, and that can result in inflammation and pain. In severe cases, emergency surgery may be required. If emergency surgery is not required, you will have time to prepare for the procedure. Laparoscopic surgery is an alternative to open surgery. Laparoscopic surgery has a shorter recovery time. Your common bile duct may also need to be examined during the procedure. If stones are found in the common bile duct, they may be removed. LET [facility] CARE PROVIDER KNOW ABOUT:  Any allergies you have.  All medicines you are taking, including vitamins, herbs, eye drops, creams, and over-the-counter medicines.  Previous problems you or members of your family have had  with the use of anesthetics.  Any blood disorders you have.  Previous surgeries you have had.    Any medical conditions you have. RISKS AND COMPLICATIONS Generally, this is a safe procedure. However, problems may occur, including:  Infection.  Bleeding.  Allergic reactions to medicines.  Damage to other structures or organs.  A stone remaining in the common bile duct.  A bile leak from the cyst duct that is clipped when your gallbladder is removed.  The need to convert to open surgery, which requires a larger incision in the abdomen. This may be necessary if your surgeon thinks that it is not safe to continue with a laparoscopic procedure. BEFORE THE PROCEDURE  Ask your health care provider about:  Changing or stopping your regular medicines. This is especially important if you are taking diabetes medicines or blood thinners.  Taking medicines such as aspirin and ibuprofen. These medicines can thin your blood. Do not take these medicines before your procedure if your health care provider instructs you not to.  Follow instructions from your health care provider about eating or drinking restrictions.  Let your health care provider know if you develop a cold or an infection before surgery.  Plan to have someone take you home after the procedure.  Ask your health care provider how your surgical site will be marked or identified.  You may be given antibiotic medicine to help prevent infection. PROCEDURE  To reduce your risk of infection:  Your health care team will wash or sanitize their hands.  Your skin will be washed with soap.  An IV tube may be  inserted into one of your veins.  You will be given a medicine to make you fall asleep (general anesthetic).  A breathing tube will be placed in your mouth.  The surgeon will make several small cuts (incisions) in your abdomen.  A thin, lighted tube (laparoscope) that has a tiny camera on the end will be inserted  through one of the small incisions. The camera on the laparoscope will send a picture to a TV screen (monitor) in the operating room. This will give the surgeon a good view inside your abdomen.  A gas will be pumped into your abdomen. This will expand your abdomen to give the surgeon more room to perform the surgery.  Other tools that are needed for the procedure will be inserted through the other incisions. The gallbladder will be removed through one of the incisions.  After your gallbladder has been removed, the incisions will be closed with stitches (sutures), staples, or skin glue.  Your incisions may be covered with a bandage (dressing). The procedure may vary among health care providers and hospitals. AFTER THE PROCEDURE  Your blood pressure, heart rate, breathing rate, and blood oxygen level will be monitored often until the medicines you were given have worn off.  You will be given medicines as needed to control your pain.   This information is not intended to replace advice given to you by your health care provider. Make sure you discuss any questions you have with your health care provider.   Document Released: 06/26/2005 Document Revised: 03/17/2015 Document Reviewed: 02/05/2013 Elsevier Interactive Patient Education 2016 [place] Diet for Gallbladder Conditions A low-fat diet can be helpful if you have pancreatitis or a gallbladder condition. With these conditions, your pancreas and gallbladder have trouble digesting fats. A healthy eating plan with less fat will help rest your pancreas and gallbladder and reduce your symptoms. WHAT DO I NEED TO KNOW ABOUT THIS DIET?  Eat a low-fat diet.  Reduce your fat intake to less than 20-30% of your total daily calories. This is less than 50-60 g of fat per day.  Remember that you need some fat in your diet. Ask your dietician what your daily goal should be.  Choose nonfat and low-fat healthy foods. Look for the words  "nonfat," "low fat," or "fat free."  As a guide, look on the label and choose foods with less than 3 g of fat per serving. Eat only one serving.  Avoid alcohol.  Do not smoke. If you need help quitting, talk with your health care provider.  Eat small frequent meals instead of three large heavy meals. WHAT FOODS CAN I EAT? Grains Include healthy grains and starches such as potatoes, wheat bread, fiber-rich cereal, and brown rice. Choose whole grain options whenever possible. In adults, whole grains should account for 45-65% of your daily calories.  Fruits and Vegetables Eat plenty of fruits and vegetables. Fresh fruits and vegetables add fiber to your diet. Meats and Other Protein Sources Eat lean meat such as chicken and pork. Trim any fat off of meat before cooking it. Eggs, fish, and beans are other sources of protein. In adults, these foods should account for 10-35% of your daily calories. Dairy Choose low-fat milk and dairy options. Dairy includes fat and protein, as well as calcium.  Fats and Oils Limit high-fat foods such as fried foods, sweets, baked goods, sugary drinks.  Other Creamy sauces and condiments, such as mayonnaise, can add extra fat. Think about whether  or not you need to use them, or use smaller amounts or low fat options. WHAT FOODS ARE NOT RECOMMENDED?  High fat foods, such as:  [employer].  Ice cream.  [place] toast.  Sweet rolls.  Pizza.  Cheese bread.  Foods covered with batter, butter, creamy sauces, or cheese.  Fried foods.  Sugary drinks and desserts.  Foods that cause gas or bloating   This information is not intended to replace advice given to you by your health care provider. Make sure you discuss any questions you have with your health care provider.   Document Released: 07/01/2013 Document Reviewed: 07/01/2013 Elsevier Interactive Patient Education [employer].

## 2020-09-14 NOTE — H&P (View-Only) (Signed)
Patient ID: Christy Stanton, female   DOB: 01/22/1946, 75 y.o.   MRN: [id number]  Chief Complaint: Right upper quadrant pain, gallstones.  History of Present Illness Christy Stanton is a 75 y.o. female with her first episode of right upper quadrant pain, not aware of eating anything particularly different, moderately acute onset that persisted into the afternoon after her meal.  Evaluated by ultrasound, normal WBC, & LFTs with exception of T. Bili of 1.6.  While in the ED she had resolution of the pain, I wanted to pursue outpatient follow-up.  Her pain was right upper quadrant with some radiation to the right flank, no real back pain.  Associated nausea with vomiting.  No prior episodes of similar pain.  Past Medical History Past Medical History:  Diagnosis Date  . Chronic systolic heart failure (HCC)   . Hyperlipidemia   . Hypertension   . Hypothyroidism    1. 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 Nonischemic dilated cardiomyopathy 3. Chronic systolic congestive heart failure NYHA class II-III ACC/AHA stage C Etiology: HTN /valvular heart disease HFrEF 20% 4. 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 Essential hypertension 6. 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 CARDIAC SURGERY    . MITRAL VALVE REPAIR  09/15/2013  . TRANSESOPHAGEAL ECHOCARDIOGRAPHY      Allergies  Allergen Reactions  . Erythromycin Base Nausea And Vomiting    Other reaction(s): OTHER Other reaction(s): Unknown Other reaction(s): OTHER Other reaction(s): OTHER Pt states she can take this medication, but causes severe GI upset   . Penicillins Nausea And Vomiting and Other (See Comments)    Other reaction(s): RASH Childhood allergy. Skin reaction. No anaphylaxis. Other reaction(s): RASH Other reaction(s): RASH Childhood allergy. Skin reaction. No anaphylaxis. Other reaction(s): RASH Childhood allergy. Skin reaction. No  anaphylaxis. Other reaction(s): RASH     Current Outpatient Medications  Medication Sig Dispense Refill  . aspirin EC 81 MG tablet Take by mouth.    . carvedilol (COREG) 12.5 MG tablet Take 12.5 mg by mouth 2 (two) times daily.    . ciprofloxacin (CIPRO) 500 MG tablet Take 1 tablet by mouth 2 (two) times daily.    . furosemide (LASIX) 20 MG tablet once daily as needed.    . levothyroxine (SYNTHROID) 50 MCG tablet Take by mouth.    . lisinopril (ZESTRIL) 20 MG tablet Take by mouth.    . losartan (COZAAR) 25 MG tablet Take 25 mg by mouth daily.    . omeprazole (PRILOSEC) 20 MG capsule Take by mouth.    . potassium chloride (KLOR-CON) 10 MEQ tablet Take 10 mEq by mouth daily.    . simvastatin (ZOCOR) 20 MG tablet TAKE 1 TABLET BY MOUTH AT BEDTIME FOR CHOLESTEROL     No current facility-administered medications for this visit.    Family History Family History  Problem Relation Age of Onset  . Heart disease Mother       Social History Social History   Tobacco Use  . Smoking status: Never Smoker  . Smokeless tobacco: Never Used  Vaping Use  . Vaping Use: Never used  Substance Use Topics  . Alcohol use: Never  . Drug use: Never        Review of Systems  Constitutional: Negative.   HENT: Negative.   Eyes: Negative.   Respiratory: Negative.   Cardiovascular: Negative.   Gastrointestinal: Positive for abdominal pain, diarrhea, nausea and vomiting.    Genitourinary: Positive for hematuria (History of kidney stone).  Skin: Negative.   Neurological: Negative.   Psychiatric/Behavioral: Negative.       Physical Exam Blood pressure 126/84, pulse 78, temperature 98.3 F (36.8 C), height 5\' 2"  (1.575 m), weight 169 lb (76.7 kg), SpO2 98 %. Last Weight  Most recent update: 09/14/2020  1:28 PM   Weight  76.7 kg (169 lb)            CONSTITUTIONAL: Well developed, and nourished, appropriately responsive and aware without distress.   EYES: Sclera non-icteric.   EARS, NOSE,  MOUTH AND THROAT: Mask worn.    Hearing is intact to voice.  NECK: Trachea is midline, and there is no jugular venous distension.  LYMPH NODES:  Lymph nodes in the neck are not enlarged. RESPIRATORY:  Lungs are clear, and breath sounds are equal bilaterally. Normal respiratory effort without pathologic use of accessory muscles. CARDIOVASCULAR: Heart is regular in rate and rhythm. GI: The abdomen is soft, nontender, and nondistended. There were no palpable masses. I did not appreciate hepatosplenomegaly. There were normal bowel sounds. MUSCULOSKELETAL:  Symmetrical muscle tone appreciated in all four extremities.    SKIN: Skin turgor is normal. No pathologic skin lesions appreciated.  NEUROLOGIC:  Motor and sensation appear grossly normal.  Cranial nerves are grossly without defect. PSYCH:  Alert and oriented to person, place and time. Affect is appropriate for situation.  Data Reviewed I have personally reviewed what is currently available of the patient's imaging, recent labs and medical records.   Labs:  CBC Latest Ref Rng & Units 09/13/2020 02/07/2014 01/29/2014  WBC 4.0 - 10.5 K/uL 6.5 5.0 4.0  Hemoglobin 12.0 - 15.0 g/dL 01/31/2014 02.7 25.3  Hematocrit 36.0 - 46.0 % 41.1 38.5 39.0  Platelets 150 - 400 K/uL 191 145(L) 175   CMP Latest Ref Rng & Units 09/13/2020 02/07/2014 01/29/2014  Glucose 70 - 99 mg/dL 79 91 82  BUN 8 - 23 mg/dL 20 14 17   Creatinine 0.44 - 1.00 mg/dL 01/31/2014 4.03  Sodium 135 - 145 mmol/L 141 142 143  Potassium 3.5 - 5.1 mmol/L 4.1 4.3 3.7  Chloride 98 - 111 mmol/L 102 106 109(H)  CO2 22 - 32 mmol/L 26 33(H) 30  Calcium 8.9 - 10.3 mg/dL 9.4 8.8 8.6  Total Protein 6.5 - 8.1 g/dL 7.1 4.74) -  Total Bilirubin 0.3 - 1.2 mg/dL 2.59) 0.7 -  Alkaline Phos 38 - 126 U/L 74 86 -  AST 15 - 41 U/L 27 30 -  ALT 0 - 44 U/L 20 17 -      Imaging: Radiology review:   Within last 24 hrs: DG Chest 2 View  Result Date: 09/13/2020 CLINICAL DATA:  Right upper quadrant pain and  vomiting with diarrhea. EXAM: CHEST - 2 VIEW COMPARISON:  February 07, 2014 FINDINGS: There is a multi lead AICD. Chronic appearing increased interstitial lung markings are noted. There is no evidence of acute infiltrate, pleural effusion or pneumothorax. The cardiac silhouette is mildly enlarged. An artificial cardiac valve is present. Degenerative changes seen throughout the thoracic spine. IMPRESSION: Stable exam without acute or active cardiopulmonary disease. Electronically Signed   By: 11/13/2020 M.D.   On: 09/13/2020 16:02   Aram Candela ABDOMEN LIMITED RUQ (LIVER/GB)  Result Date: 09/13/2020 CLINICAL DATA:  Right upper quadrant pain x2 days. EXAM: ULTRASOUND ABDOMEN LIMITED RIGHT UPPER QUADRANT COMPARISON:  None. FINDINGS: Gallbladder: Echogenic sludge within the gallbladder lumen. An 8 mm shadowing, echogenic gallstone is also  present. There is no evidence of gallbladder wall thickening (2.2 mm) or pericholecystic fluid. No sonographic Murphy sign noted by sonographer. Common bile duct: Diameter: 4.8 mm Liver: No focal lesion identified. Diffusely increased echogenicity of the liver parenchyma is noted. Portal vein is patent on color Doppler imaging with normal direction of blood flow towards the liver. Other: None. IMPRESSION: 1. Cholelithiasis and gallbladder sludge without evidence of acute cholecystitis. 2. Fatty liver. Electronically Signed   By: Aram Candela M.D.   On: 09/13/2020 16:25    Assessment    Chronic calculus cholecystitis, with recent symptomatic attack. There are no problems to display for this patient.  1. 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 Nonischemic dilated cardiomyopathy 3. Chronic systolic congestive heart failure NYHA class II-III ACC/AHA stage C Etiology: HTN /valvular heart disease HFrEF 20% 4. 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 Essential hypertension 6. 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  Robotic cholecystectomy.  This patient was seen and examined, and I concur with the H&P associated with this note.  This was discussed thoroughly.  Optimal plan is for robotic cholecystectomy.  Risks and benefits have been discussed with the patient which include but are not limited to anesthesia, bleeding, infection, biliary ductal injury or stenosis, other associated unanticipated injuries affiliated with laparoscopic surgery.  I believe there is the desire to proceed, interpreter utilized as needed.  Questions elicited and answered to satisfaction.  No guarantees ever expressed or implied.  Face-to-face time spent with the patient and accompanying care providers(if present) was 30 minutes, with more than 50% of the time spent counseling, educating, and coordinating care of the patient.      Campbell Lerner M.D., FACS 09/14/2020, 2:29 PM

## 2020-09-14 NOTE — Progress Notes (Addendum)
Patient ID: Christy Stanton, female   DOB: 1946/03/02, 75 y.o.   MRN: [id number]  Chief Complaint: Right upper quadrant pain, gallstones.  History of Present Illness Christy Stanton is a 75 y.o. female with her first episode of right upper quadrant pain, not aware of eating anything particularly different, moderately acute onset that persisted into the afternoon after her meal.  Evaluated by ultrasound, normal WBC, & LFTs with exception of T. Bili of 1.6.  While in the ED she had resolution of the pain, I wanted to pursue outpatient follow-up.  Her pain was right upper quadrant with some radiation to the right flank, no real back pain.  Associated nausea with vomiting.  No prior episodes of similar pain.  Past Medical History Past Medical History:  Diagnosis Date  . Chronic systolic heart failure (HCC)   . Hyperlipidemia   . Hypertension   . Hypothyroidism    1. 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 Nonischemic dilated cardiomyopathy 3. Chronic systolic congestive heart failure NYHA class II-III ACC/AHA stage C Etiology: HTN /valvular heart disease HFrEF 20% 4. 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 Essential hypertension 6. 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 CARDIAC SURGERY    . MITRAL VALVE REPAIR  09/15/2013  . TRANSESOPHAGEAL ECHOCARDIOGRAPHY      Allergies  Allergen Reactions  . Erythromycin Base Nausea And Vomiting    Other reaction(s): OTHER Other reaction(s): Unknown Other reaction(s): OTHER Other reaction(s): OTHER Pt states she can take this medication, but causes severe GI upset   . Penicillins Nausea And Vomiting and Other (See Comments)    Other reaction(s): RASH Childhood allergy. Skin reaction. No anaphylaxis. Other reaction(s): RASH Other reaction(s): RASH Childhood allergy. Skin reaction. No anaphylaxis. Other reaction(s): RASH Childhood allergy. Skin reaction. No  anaphylaxis. Other reaction(s): RASH     Current Outpatient Medications  Medication Sig Dispense Refill  . aspirin EC 81 MG tablet Take by mouth.    . carvedilol (COREG) 12.5 MG tablet Take 12.5 mg by mouth 2 (two) times daily.    . ciprofloxacin (CIPRO) 500 MG tablet Take 1 tablet by mouth 2 (two) times daily.    . furosemide (LASIX) 20 MG tablet once daily as needed.    Marland Kitchen levothyroxine (SYNTHROID) 50 MCG tablet Take by mouth.    Marland Kitchen lisinopril (ZESTRIL) 20 MG tablet Take by mouth.    . losartan (COZAAR) 25 MG tablet Take 25 mg by mouth daily.    Marland Kitchen omeprazole (PRILOSEC) 20 MG capsule Take by mouth.    . potassium chloride (KLOR-CON) 10 MEQ tablet Take 10 mEq by mouth daily.    . simvastatin (ZOCOR) 20 MG tablet TAKE 1 TABLET BY MOUTH AT BEDTIME FOR CHOLESTEROL     No current facility-administered medications for this visit.    Family History Family History  Problem Relation Age of Onset  . Heart disease Mother       Social History Social History   Tobacco Use  . Smoking status: Never Smoker  . Smokeless tobacco: Never Used  Vaping Use  . Vaping Use: Never used  Substance Use Topics  . Alcohol use: Never  . Drug use: Never        Review of Systems  Constitutional: Negative.   HENT: Negative.   Eyes: Negative.   Respiratory: Negative.   Cardiovascular: Negative.   Gastrointestinal: Positive for abdominal pain, diarrhea, nausea and vomiting.  Genitourinary: Positive for hematuria (History of kidney stone).  Skin: Negative.   Neurological: Negative.   Psychiatric/Behavioral: Negative.       Physical Exam Blood pressure 126/84, pulse 78, temperature 98.3 F (36.8 C), height 5\' 2"  (1.575 m), weight 169 lb (76.7 kg), SpO2 98 %. Last Weight  Most recent update: 09/14/2020  1:28 PM   Weight  76.7 kg (169 lb)            CONSTITUTIONAL: Well developed, and nourished, appropriately responsive and aware without distress.   EYES: Sclera non-icteric.   EARS, NOSE,  MOUTH AND THROAT: Mask worn.    Hearing is intact to voice.  NECK: Trachea is midline, and there is no jugular venous distension.  LYMPH NODES:  Lymph nodes in the neck are not enlarged. RESPIRATORY:  Lungs are clear, and breath sounds are equal bilaterally. Normal respiratory effort without pathologic use of accessory muscles. CARDIOVASCULAR: Heart is regular in rate and rhythm. GI: The abdomen is soft, nontender, and nondistended. There were no palpable masses. I did not appreciate hepatosplenomegaly. There were normal bowel sounds. MUSCULOSKELETAL:  Symmetrical muscle tone appreciated in all four extremities.    SKIN: Skin turgor is normal. No pathologic skin lesions appreciated.  NEUROLOGIC:  Motor and sensation appear grossly normal.  Cranial nerves are grossly without defect. PSYCH:  Alert and oriented to person, place and time. Affect is appropriate for situation.  Data Reviewed I have personally reviewed what is currently available of the patient's imaging, recent labs and medical records.   Labs:  CBC Latest Ref Rng & Units 09/13/2020 02/07/2014 01/29/2014  WBC 4.0 - 10.5 K/uL 6.5 5.0 4.0  Hemoglobin 12.0 - 15.0 g/dL 01/31/2014 02.7 25.3  Hematocrit 36.0 - 46.0 % 41.1 38.5 39.0  Platelets 150 - 400 K/uL 191 145(L) 175   CMP Latest Ref Rng & Units 09/13/2020 02/07/2014 01/29/2014  Glucose 70 - 99 mg/dL 79 91 82  BUN 8 - 23 mg/dL 20 14 17   Creatinine 0.44 - 1.00 mg/dL 01/31/2014 4.03  Sodium 135 - 145 mmol/L 141 142 143  Potassium 3.5 - 5.1 mmol/L 4.1 4.3 3.7  Chloride 98 - 111 mmol/L 102 106 109(H)  CO2 22 - 32 mmol/L 26 33(H) 30  Calcium 8.9 - 10.3 mg/dL 9.4 8.8 8.6  Total Protein 6.5 - 8.1 g/dL 7.1 4.74) -  Total Bilirubin 0.3 - 1.2 mg/dL 2.59) 0.7 -  Alkaline Phos 38 - 126 U/L 74 86 -  AST 15 - 41 U/L 27 30 -  ALT 0 - 44 U/L 20 17 -      Imaging: Radiology review:   Within last 24 hrs: DG Chest 2 View  Result Date: 09/13/2020 CLINICAL DATA:  Right upper quadrant pain and  vomiting with diarrhea. EXAM: CHEST - 2 VIEW COMPARISON:  February 07, 2014 FINDINGS: There is a multi lead AICD. Chronic appearing increased interstitial lung markings are noted. There is no evidence of acute infiltrate, pleural effusion or pneumothorax. The cardiac silhouette is mildly enlarged. An artificial cardiac valve is present. Degenerative changes seen throughout the thoracic spine. IMPRESSION: Stable exam without acute or active cardiopulmonary disease. Electronically Signed   By: 11/13/2020 M.D.   On: 09/13/2020 16:02   Aram Candela ABDOMEN LIMITED RUQ (LIVER/GB)  Result Date: 09/13/2020 CLINICAL DATA:  Right upper quadrant pain x2 days. EXAM: ULTRASOUND ABDOMEN LIMITED RIGHT UPPER QUADRANT COMPARISON:  None. FINDINGS: Gallbladder: Echogenic sludge within the gallbladder lumen. An 8 mm shadowing, echogenic gallstone is also  present. There is no evidence of gallbladder wall thickening (2.2 mm) or pericholecystic fluid. No sonographic Murphy sign noted by sonographer. Common bile duct: Diameter: 4.8 mm Liver: No focal lesion identified. Diffusely increased echogenicity of the liver parenchyma is noted. Portal vein is patent on color Doppler imaging with normal direction of blood flow towards the liver. Other: None. IMPRESSION: 1. Cholelithiasis and gallbladder sludge without evidence of acute cholecystitis. 2. Fatty liver. Electronically Signed   By: Aram Candela M.D.   On: 09/13/2020 16:25    Assessment    Chronic calculus cholecystitis, with recent symptomatic attack. There are no problems to display for this patient.  1. 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 Nonischemic dilated cardiomyopathy 3. Chronic systolic congestive heart failure NYHA class II-III ACC/AHA stage C Etiology: HTN /valvular heart disease HFrEF 20% 4. 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 Essential hypertension 6. 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  Robotic cholecystectomy.  This patient was seen and examined, and I concur with the H&P associated with this note.  This was discussed thoroughly.  Optimal plan is for robotic cholecystectomy.  Risks and benefits have been discussed with the patient which include but are not limited to anesthesia, bleeding, infection, biliary ductal injury or stenosis, other associated unanticipated injuries affiliated with laparoscopic surgery.  I believe there is the desire to proceed, interpreter utilized as needed.  Questions elicited and answered to satisfaction.  No guarantees ever expressed or implied.  Face-to-face time spent with the patient and accompanying care providers(if present) was 30 minutes, with more than 50% of the time spent counseling, educating, and coordinating care of the patient.      Campbell Lerner M.D., FACS 09/14/2020, 2:29 PM

## 2020-09-15 ENCOUNTER — Telehealth: Payer: Self-pay | Admitting: Surgery

## 2020-09-15 NOTE — Telephone Encounter (Signed)
Patient has been advised of Pre-Admission date/time, COVID Testing date and Surgery date.  Surgery Date: 09/24/20 Preadmission Testing Date: 09/17/20 (phone 8a-1p) Covid Testing Date: 09/22/20 - patient will be having Covid testing done at [facility], as this is closer for her. She is scheduled 09/22/20 @ 9:10 am   Patient has been made aware to call [phone], between 1-3:00pm the day before surgery, to find out what time to arrive for surgery.

## 2020-09-16 ENCOUNTER — Ambulatory Visit: Payer: Self-pay | Admitting: Surgery

## 2020-09-16 DIAGNOSIS — K8010 Calculus of gallbladder with chronic cholecystitis without obstruction: Secondary | ICD-10-CM

## 2020-09-16 NOTE — Progress Notes (Signed)
Cardiac Clearance has been received from Dr Briscoe Burns office. The patient is cleared at Low risk for surgery. Clearance has been faxed to pre admit surgery.

## 2020-09-17 ENCOUNTER — Encounter
Admission: RE | Admit: 2020-09-17 | Discharge: 2020-09-17 | Disposition: A | Discharge status: Home / Self Care | Payer: Medicare PPO | Source: Ambulatory Visit | Attending: Surgery | Admitting: Surgery

## 2020-09-17 ENCOUNTER — Other Ambulatory Visit: Payer: Self-pay

## 2020-09-17 DIAGNOSIS — Z01818 Encounter for other preprocedural examination: Secondary | ICD-10-CM | POA: Insufficient documentation

## 2020-09-17 HISTORY — DX: Family history of other specified conditions: Z84.89

## 2020-09-17 HISTORY — DX: Dyspnea, unspecified: R06.00

## 2020-09-17 HISTORY — DX: Unspecified convulsions: R56.9

## 2020-09-17 HISTORY — DX: Personal history of urinary calculi: Z87.442

## 2020-09-17 HISTORY — DX: Unspecified osteoarthritis, unspecified site: M19.90

## 2020-09-17 HISTORY — DX: Gastro-esophageal reflux disease without esophagitis: K21.9

## 2020-09-17 HISTORY — DX: Heart failure, unspecified: I50.9

## 2020-09-17 NOTE — Pre-Procedure Instructions (Signed)
CARDIAC CLEARANCE ON CHART FROM DR PARASCHOS-LOW RISK 

## 2020-09-17 NOTE — Pre-Procedure Instructions (Signed)
EKG My review and personal interpretation at Time: 15:43   Indication: ruq pain  Rate: 90  Rhythm: sinus Axis: normal Other: nonspecific st abn, unchanged from previous ekg 11:38 ____________________________________________  RADIOLOGY  I personally reviewed all radiographic images ordered to evaluate for the above acute complaints and reviewed radiology reports and findings.  These findings were personally discussed with the patient.  Please see medical record for radiology report.  ____________________________________________   PROCEDURES  Procedure(s) performed:  Procedures    Critical Care performed: no ____________________________________________   INITIAL IMPRESSION / ASSESSMENT AND PLAN / ED COURSE  Pertinent labs & imaging results that were available during my care of the patient were reviewed by me and considered in my medical decision making (see chart for details).  DDX: Cholelithiasis, cholecystitis, cholangitis, pneumonia, pleurisy, doubt ACS or CHF exacerbation.  Christy Stanton is a 75 y.o. who presents to the ED with agitation as described above.  Patient nontoxic-appearing currently pain-free.  Troponin clinic earlier was only mildly elevated.  Repeat here is 17.  Repeat EKG was and consistent with previous but appears that there is a limb later is reversible.  She not complaining of any chest pain or pressure.  Will repeat EKG and compare to clinic EKG.  Have a higher suspicion for biliary pathology.  Her white count is normal.  T bili 1.6.  She does not have any guarding or rebound she is not febrile right now.  Her ultrasound does show evidence of cholelithiasis but no evidence of cholecystitis.  She is pain-free with reassuring work-up will give referral to outpatient follow-up.  We discussed strict return precautions.  Patient agreeable to plan.  While going over discharge instructions patient mention that walk-in clinic had noted that she had many  bacteria in her urine.  Prescription antibiotic sent to her pharmacy.  The patient was evaluated in Emergency Department today for the symptoms described in the history of present illness. He/she was evaluated in the context of the global COVID-19 pandemic, which necessitated consideration that the patient might be at risk for infection with the SARS-CoV-2 virus that causes COVID-19. Institutional protocols and algorithms that pertain to the evaluation of patients at risk for COVID-19 are in a state of rapid change based on information released by regulatory bodies including the CDC and federal and state organizations. These policies and algorithms were followed during the patient's care in the ED.  As part of my medical decision making, I reviewed the following data within the electronic MEDICAL RECORD NUMBERNursing notes reviewed and incorporated, Labs reviewed, notes from prior ED visits and [place] Controlled Substance Database   ____________________________________________   FINAL CLINICAL IMPRESSION(S) / ED DIAGNOSES  Final diagnoses:  RUQ abdominal pain  Calculus of gallbladder without cholecystitis without obstruction      NEW MEDICATIONS STARTED DURING THIS VISIT:      New Prescriptions   NITROFURANTOIN, MACROCRYSTAL-MONOHYDRATE, (MACROBID) 100 MG CAPSULE    Take 1 capsule (100 mg total) by mouth 2 (two) times daily for 3 days.     Note:  This document was prepared using Dragon voice recognition software and may include unintentional dictation errors.    Willy Eddy, MD 09/13/20 1724    Willy Eddy, MD 09/13/20 1759     Electronically signed by Willy Eddy, MD at 09/13/2020 5:24 PM  Electronically signed by Willy Eddy, MD at 09/13/2020 5:59 PM  ED on 09/13/2020      ED on 09/13/2020  Revision History         Detailed Report       Note shared with patient    Additional Orders and  Documentation     Results  Imaging      Meds         Orders         Flowsheets      Encounter Info:  History,   Allergies,   Detailed Report       Clinical Impressions     RUQ abdominal pain     Calculus of gallbladder without cholecystitis without obstruction    Disposition     Discharge           ED After Visit Summary (Printed 09/13/2020)          Follow-Ups: Schedule an appointment with Campbell Lerner, MD (General Surgery)    Medication Changes     Nitrofurantoin Monohyd Macro 100 mg Oral 2 times daily    Medication List at Discharge   Care Timeline  1225  Arrived  1246  Comprehensive metabolic panel     CBC    Lipase, blood    Troponin I (High Sensitivity)  1543  ED EKG  1558  DG Chest 2 View  1621  US ABDOMEN LIMITED RUQ (LIVER/GB)  1801  Discharged

## 2020-09-17 NOTE — Patient Instructions (Addendum)
Your procedure is scheduled on:09-24-20 FRIDAY Report to the Registration Desk on the 1st floor of the Medical Mall-Then proceed to the 2nd floor Surgery Desk in the Medical Mall To find out your arrival time, please call [phone] between 1PM - 3PM on:09-23-20 THURSDAY  REMEMBER: Instructions that are not followed completely may result in serious medical risk, up to and including death; or upon the discretion of your surgeon and anesthesiologist your surgery may need to be rescheduled.  Do not eat food after midnight the night before surgery.  No gum chewing, lozengers or hard candies.  You may however, drink CLEAR liquids up to 2 hours before you are scheduled to arrive for your surgery. Do not drink anything within 2 hours of your scheduled arrival time.  Clear liquids include: - water  - apple juice without pulp - gatorade - black coffee or tea (Do NOT add milk or creamers to the coffee or tea) Do NOT drink anything that is not on this list.  TAKE THESE MEDICATIONS THE MORNING OF SURGERY WITH A SIP OF WATER: -SYNTHROID (LEVOTHYROXINE) -COREG (CARVEDILOL) -PRILOSEC (OMEPRAZOLE)-take one the night before and one on the morning of surgery - helps to prevent nausea after surgery.)  Follow recommendations from Cardiologist, Pulmonologist or PCP regarding stopping Aspirin, Coumadin, Plavix, Eliquis, Pradaxa, or Pletal-PT STATES THAT DR RODENBERG TOLD PT THAT SHE MAY CONTINUE HER 81 MG ASPIRIN-DO NOT TAKE ASPIRIN THE MORNING OF SURGERY  One week prior to surgery: Stop Anti-inflammatories (NSAIDS) such as Advil, Aleve, Ibuprofen, Motrin, Naproxen, Naprosyn and Aspirin based products such as Excedrin, Goodys Powder, BC Powder-OK TO TAKE TYLENOL IF NEEDED  Stop ANY OVER THE COUNTER supplements until after surgery-HOWEVER YOU MAY CONTINUE YOUR MULTIVITAMIN UP UNTIL THE DAY BEFORE YOUR SURGERY  No Alcohol for 24 hours before or after surgery.  No Smoking including e-cigarettes for 24  hours prior to surgery.  No chewable tobacco products for at least 6 hours prior to surgery.  No nicotine patches on the day of surgery.  Do not use any "recreational" drugs for at least a week prior to your surgery.  Please be advised that the combination of cocaine and anesthesia may have negative outcomes, up to and including death. If you test positive for cocaine, your surgery will be cancelled.  On the morning of surgery brush your teeth with toothpaste and water, you may rinse your mouth with mouthwash if you wish. Do not swallow any toothpaste or mouthwash.  Do not wear jewelry, make-up, hairpins, clips or nail polish.  Do not wear lotions, powders, or perfumes.   Do not shave body from the neck down 48 hours prior to surgery just in case you cut yourself which could leave a site for infection.  Also, freshly shaved skin may become irritated if using the CHG soap.  Contact lenses, hearing aids and dentures may not be worn into surgery.  Do not bring valuables to the hospital. [facility] is not responsible for any missing/lost belongings or valuables.   USE YOUR DIAL SOAP AT HOME-SHOWER THE NIGHT BEFORE YOUR SURGERY AND THE MORNING OF YOUR SURGERY  Notify your doctor if there is any change in your medical condition (cold, fever, infection).  Wear comfortable clothing (specific to your surgery type) to the hospital.  Plan for stool softeners for home use; pain medications have a tendency to cause constipation. You can also help prevent constipation by eating foods high in fiber such as fruits and vegetables and drinking plenty of fluids  as your diet allows.  After surgery, you can help prevent lung complications by doing breathing exercises.  Take deep breaths and cough every 1-2 hours. Your doctor may order a device called an Incentive Spirometer to help you take deep breaths. When coughing or sneezing, hold a pillow firmly against your incision with both hands. This is  called "splinting." Doing this helps protect your incision. It also decreases belly discomfort.  If you are being admitted to the hospital overnight, leave your suitcase in the car. After surgery it may be brought to your room.  If you are being discharged the day of surgery, you will not be allowed to drive home. You will need a responsible adult (18 years or older) to drive you home and stay with you that night.   If you are taking public transportation, you will need to have a responsible adult (18 years or older) with you. Please confirm with your physician that it is acceptable to use public transportation.   Please call the Pre-admissions Testing Dept. at [phone] if you have any questions about these instructions.  Surgery Visitation Policy:  Patients undergoing a surgery or procedure may have one family member or support person with them as long as that person is not COVID-19 positive or experiencing its symptoms.  That person may remain in the waiting area during the procedure.  Inpatient Visitation:    Visiting hours are 7 a.m. to 8 p.m. Inpatients will be allowed two visitors daily. The visitors may change each day during the patient's stay. No visitors under the age of 78. Any visitor under the age of 67 must be accompanied by an adult. The visitor must pass COVID-19 screenings, use hand sanitizer when entering and exiting the patient's room and wear a mask at all times, including in the patient's room. Patients must also wear a mask when staff or their visitor are in the room. Masking is required regardless of vaccination status.

## 2020-09-22 ENCOUNTER — Other Ambulatory Visit: Payer: Self-pay

## 2020-09-22 ENCOUNTER — Other Ambulatory Visit: Payer: Medicare PPO

## 2020-09-22 ENCOUNTER — Other Ambulatory Visit (HOSPITAL_COMMUNITY)
Admission: RE | Admit: 2020-09-22 | Discharge: 2020-09-22 | Disposition: A | Discharge status: Home / Self Care | Payer: Medicare PPO | Source: Ambulatory Visit | Attending: Surgery | Admitting: Surgery

## 2020-09-22 DIAGNOSIS — Z01812 Encounter for preprocedural laboratory examination: Secondary | ICD-10-CM | POA: Diagnosis present

## 2020-09-22 DIAGNOSIS — Z20822 Contact with and (suspected) exposure to covid-19: Secondary | ICD-10-CM | POA: Insufficient documentation

## 2020-09-22 LAB — SARS CORONAVIRUS 2 (TAT 6-24 HRS): SARS Coronavirus 2: NEGATIVE

## 2020-09-24 ENCOUNTER — Encounter: Payer: Self-pay | Admitting: Surgery

## 2020-09-24 ENCOUNTER — Ambulatory Visit: Payer: Medicare PPO | Admitting: Anesthesiology

## 2020-09-24 ENCOUNTER — Other Ambulatory Visit: Payer: Self-pay

## 2020-09-24 ENCOUNTER — Encounter
Admission: RE | Disposition: A | Discharge status: Home / Self Care | Payer: Self-pay | Source: Ambulatory Visit | Attending: Surgery

## 2020-09-24 ENCOUNTER — Ambulatory Visit
Admission: RE | Admit: 2020-09-24 | Discharge: 2020-09-24 | Disposition: A | Discharge status: Home / Self Care | Payer: Medicare PPO | Source: Ambulatory Visit | Attending: Surgery | Admitting: Surgery

## 2020-09-24 DIAGNOSIS — Z79899 Other long term (current) drug therapy: Secondary | ICD-10-CM | POA: Insufficient documentation

## 2020-09-24 DIAGNOSIS — I9752 Accidental puncture and laceration of a circulatory system organ or structure during other procedure: Secondary | ICD-10-CM | POA: Insufficient documentation

## 2020-09-24 DIAGNOSIS — Z880 Allergy status to penicillin: Secondary | ICD-10-CM | POA: Diagnosis not present

## 2020-09-24 DIAGNOSIS — Y658 Other specified misadventures during surgical and medical care: Secondary | ICD-10-CM | POA: Diagnosis not present

## 2020-09-24 DIAGNOSIS — I110 Hypertensive heart disease with heart failure: Secondary | ICD-10-CM | POA: Insufficient documentation

## 2020-09-24 DIAGNOSIS — Z7982 Long term (current) use of aspirin: Secondary | ICD-10-CM | POA: Insufficient documentation

## 2020-09-24 DIAGNOSIS — I5022 Chronic systolic (congestive) heart failure: Secondary | ICD-10-CM | POA: Insufficient documentation

## 2020-09-24 DIAGNOSIS — I420 Dilated cardiomyopathy: Secondary | ICD-10-CM | POA: Insufficient documentation

## 2020-09-24 DIAGNOSIS — K8010 Calculus of gallbladder with chronic cholecystitis without obstruction: Secondary | ICD-10-CM | POA: Insufficient documentation

## 2020-09-24 DIAGNOSIS — Y92234 Operating room of hospital as the place of occurrence of the external cause: Secondary | ICD-10-CM | POA: Diagnosis not present

## 2020-09-24 SURGERY — CHOLECYSTECTOMY, ROBOT-ASSISTED, LAPAROSCOPIC
Anesthesia: General | Site: Abdomen

## 2020-09-24 MED ORDER — DEXAMETHASONE SODIUM PHOSPHATE 10 MG/ML IJ SOLN
INTRAMUSCULAR | Status: AC
  Filled 2020-09-24: qty 1

## 2020-09-24 MED ORDER — GABAPENTIN 300 MG PO CAPS: 300.0000 mg | ORAL_CAPSULE | ORAL | Status: AC

## 2020-09-24 MED ORDER — OXYCODONE HCL 5 MG PO TABS: 5.0000 mg | ORAL_TABLET | Freq: Once | ORAL | Status: DC | PRN

## 2020-09-24 MED ORDER — FENTANYL CITRATE (PF) 100 MCG/2ML IJ SOLN
25.0000 ug | INTRAMUSCULAR | Status: DC | PRN
  Administered 2020-09-24: 25 ug via INTRAVENOUS

## 2020-09-24 MED ORDER — FENTANYL CITRATE (PF) 100 MCG/2ML IJ SOLN
INTRAMUSCULAR | Status: AC
  Filled 2020-09-24: qty 2

## 2020-09-24 MED ORDER — ONDANSETRON HCL 4 MG/2ML IJ SOLN: 4.0000 mg | Freq: Once | INTRAMUSCULAR | Status: DC | PRN

## 2020-09-24 MED ORDER — PHENYLEPHRINE HCL (PRESSORS) 10 MG/ML IV SOLN
INTRAVENOUS | Status: DC | PRN
  Administered 2020-09-24 (×2): 100 ug via INTRAVENOUS

## 2020-09-24 MED ORDER — CELECOXIB 200 MG PO CAPS
ORAL_CAPSULE | ORAL | Status: AC
  Administered 2020-09-24: 200 mg via ORAL
  Filled 2020-09-24: qty 1

## 2020-09-24 MED ORDER — IBUPROFEN 800 MG PO TABS: 800.0000 mg | ORAL_TABLET | Freq: Three times a day (TID) | ORAL | 0 refills | Status: AC | PRN

## 2020-09-24 MED ORDER — CHLORHEXIDINE GLUCONATE CLOTH 2 % EX PADS
6.0000 | MEDICATED_PAD | Freq: Once | CUTANEOUS | Status: AC
  Administered 2020-09-24: 6 via TOPICAL

## 2020-09-24 MED ORDER — BUPIVACAINE-EPINEPHRINE (PF) 0.25% -1:200000 IJ SOLN
INTRAMUSCULAR | Status: DC | PRN
  Administered 2020-09-24: 30 mL via PERINEURAL

## 2020-09-24 MED ORDER — ROCURONIUM BROMIDE 100 MG/10ML IV SOLN
INTRAVENOUS | Status: DC | PRN
  Administered 2020-09-24: 10 mg via INTRAVENOUS
  Administered 2020-09-24: 50 mg via INTRAVENOUS

## 2020-09-24 MED ORDER — LIDOCAINE HCL (CARDIAC) PF 100 MG/5ML IV SOSY
PREFILLED_SYRINGE | INTRAVENOUS | Status: DC | PRN
  Administered 2020-09-24: 80 mg via INTRAVENOUS

## 2020-09-24 MED ORDER — GABAPENTIN 300 MG PO CAPS
ORAL_CAPSULE | ORAL | Status: AC
  Administered 2020-09-24: 300 mg via ORAL
  Filled 2020-09-24: qty 1

## 2020-09-24 MED ORDER — ACETAMINOPHEN 500 MG PO TABS
ORAL_TABLET | ORAL | Status: AC
  Administered 2020-09-24: 1000 mg via ORAL
  Filled 2020-09-24: qty 2

## 2020-09-24 MED ORDER — DEXAMETHASONE SODIUM PHOSPHATE 10 MG/ML IJ SOLN
INTRAMUSCULAR | Status: DC | PRN
  Administered 2020-09-24: 10 mg via INTRAVENOUS

## 2020-09-24 MED ORDER — CIPROFLOXACIN IN D5W 400 MG/200ML IV SOLN
400.0000 mg | INTRAVENOUS | Status: AC
  Administered 2020-09-24: 400 mg via INTRAVENOUS

## 2020-09-24 MED ORDER — FENTANYL CITRATE (PF) 100 MCG/2ML IJ SOLN
INTRAMUSCULAR | Status: AC
  Administered 2020-09-24: 25 ug via INTRAVENOUS
  Filled 2020-09-24: qty 2

## 2020-09-24 MED ORDER — FENTANYL CITRATE (PF) 100 MCG/2ML IJ SOLN
INTRAMUSCULAR | Status: DC | PRN
  Administered 2020-09-24 (×2): 50 ug via INTRAVENOUS

## 2020-09-24 MED ORDER — GLYCOPYRROLATE 0.2 MG/ML IJ SOLN
INTRAMUSCULAR | Status: DC | PRN
  Administered 2020-09-24: .2 mg via INTRAVENOUS

## 2020-09-24 MED ORDER — LACTATED RINGERS IV SOLN: INTRAVENOUS | Status: DC

## 2020-09-24 MED ORDER — LIDOCAINE HCL (PF) 2 % IJ SOLN
INTRAMUSCULAR | Status: AC
  Filled 2020-09-24: qty 5

## 2020-09-24 MED ORDER — INDOCYANINE GREEN 25 MG IV SOLR
2.5000 mg | Freq: Once | INTRAVENOUS | Status: AC
  Administered 2020-09-24: 2.5 mg via INTRAVENOUS
  Filled 2020-09-24: qty 1

## 2020-09-24 MED ORDER — OXYCODONE HCL 5 MG PO TABS: 5.0000 mg | ORAL_TABLET | Freq: Four times a day (QID) | ORAL | 0 refills | Status: DC | PRN

## 2020-09-24 MED ORDER — CELECOXIB 200 MG PO CAPS: 200.0000 mg | ORAL_CAPSULE | ORAL | Status: AC

## 2020-09-24 MED ORDER — PROPOFOL 10 MG/ML IV BOLUS
INTRAVENOUS | Status: AC
  Filled 2020-09-24: qty 20

## 2020-09-24 MED ORDER — BUPIVACAINE LIPOSOME 1.3 % IJ SUSP: 20.0000 mL | Freq: Once | INTRAMUSCULAR | Status: DC

## 2020-09-24 MED ORDER — ACETAMINOPHEN 500 MG PO TABS: 1000.0000 mg | ORAL_TABLET | ORAL | Status: AC

## 2020-09-24 MED ORDER — ONDANSETRON HCL 4 MG/2ML IJ SOLN
INTRAMUSCULAR | Status: AC
  Filled 2020-09-24: qty 2

## 2020-09-24 MED ORDER — CHLORHEXIDINE GLUCONATE 0.12 % MT SOLN
15.0000 mL | Freq: Once | OROMUCOSAL | Status: AC
  Administered 2020-09-24: 15 mL via OROMUCOSAL

## 2020-09-24 MED ORDER — ORAL CARE MOUTH RINSE: 15.0000 mL | Freq: Once | OROMUCOSAL | Status: AC

## 2020-09-24 MED ORDER — DEXMEDETOMIDINE (PRECEDEX) IN NS 20 MCG/5ML (4 MCG/ML) IV SYRINGE
PREFILLED_SYRINGE | INTRAVENOUS | Status: AC
  Filled 2020-09-24: qty 5

## 2020-09-24 MED ORDER — BUPIVACAINE-EPINEPHRINE (PF) 0.25% -1:200000 IJ SOLN
INTRAMUSCULAR | Status: AC
  Filled 2020-09-24: qty 30

## 2020-09-24 MED ORDER — ONDANSETRON HCL 4 MG/2ML IJ SOLN
INTRAMUSCULAR | Status: DC | PRN
  Administered 2020-09-24 (×2): 4 mg via INTRAVENOUS

## 2020-09-24 MED ORDER — CIPROFLOXACIN IN D5W 400 MG/200ML IV SOLN
INTRAVENOUS | Status: AC
  Filled 2020-09-24: qty 200

## 2020-09-24 MED ORDER — PROPOFOL 10 MG/ML IV BOLUS
INTRAVENOUS | Status: DC | PRN
  Administered 2020-09-24: 100 mg via INTRAVENOUS

## 2020-09-24 MED ORDER — DEXMEDETOMIDINE (PRECEDEX) IN NS 20 MCG/5ML (4 MCG/ML) IV SYRINGE
PREFILLED_SYRINGE | INTRAVENOUS | Status: DC | PRN
  Administered 2020-09-24: 4 ug via INTRAVENOUS

## 2020-09-24 MED ORDER — OXYCODONE HCL 5 MG/5ML PO SOLN: 5.0000 mg | Freq: Once | ORAL | Status: DC | PRN

## 2020-09-24 MED ORDER — BUPIVACAINE LIPOSOME 1.3 % IJ SUSP
INTRAMUSCULAR | Status: AC
  Filled 2020-09-24: qty 20

## 2020-09-24 MED ORDER — SODIUM CHLORIDE 0.9 % IV SOLN
INTRAVENOUS | Status: DC | PRN
  Administered 2020-09-24: 25 ug/min via INTRAVENOUS

## 2020-09-24 MED ORDER — ROCURONIUM BROMIDE 10 MG/ML (PF) SYRINGE
PREFILLED_SYRINGE | INTRAVENOUS | Status: AC
  Filled 2020-09-24: qty 10

## 2020-09-24 SURGICAL SUPPLY — 50 items
ADH SKN CLS APL DERMABOND .7 (GAUZE/BANDAGES/DRESSINGS) ×2
APL PRP STRL LF DISP 70% ISPRP (MISCELLANEOUS) ×2
BAG SPEC RTRVL LRG 6X4 10 (ENDOMECHANICALS) ×2
CANISTER SUCT 1200ML W/VALVE (MISCELLANEOUS) ×3 IMPLANT
CHLORAPREP W/TINT 26 (MISCELLANEOUS) ×3 IMPLANT
CLIP VESOLOCK MED LG 6/CT (CLIP) ×6 IMPLANT
COVER TIP SHEARS 8 DVNC (MISCELLANEOUS) ×2 IMPLANT
COVER TIP SHEARS 8MM DA VINCI (MISCELLANEOUS) ×1
COVER WAND RF STERILE (DRAPES) ×3 IMPLANT
DECANTER SPIKE VIAL GLASS SM (MISCELLANEOUS) ×3 IMPLANT
DEFOGGER SCOPE WARMER CLEARIFY (MISCELLANEOUS) ×3 IMPLANT
DERMABOND ADVANCED (GAUZE/BANDAGES/DRESSINGS) ×1
DERMABOND ADVANCED .7 DNX12 (GAUZE/BANDAGES/DRESSINGS) ×2 IMPLANT
DRAPE ARM DVNC X/XI (DISPOSABLE) ×8 IMPLANT
DRAPE COLUMN DVNC XI (DISPOSABLE) ×2 IMPLANT
DRAPE DA VINCI XI ARM (DISPOSABLE) ×4
DRAPE DA VINCI XI COLUMN (DISPOSABLE) ×1
ELECT CAUTERY BLADE 6.4 (BLADE) IMPLANT
GLOVE ORTHO TXT STRL SZ7.5 (GLOVE) ×6 IMPLANT
GOWN STRL REUS W/ TWL LRG LVL3 (GOWN DISPOSABLE) ×8 IMPLANT
GOWN STRL REUS W/TWL LRG LVL3 (GOWN DISPOSABLE) ×12
GRASPER SUT TROCAR 14GX15 (MISCELLANEOUS) ×3 IMPLANT
INFUSOR MANOMETER BAG 3000ML (MISCELLANEOUS) IMPLANT
IRRIGATION STRYKERFLOW (MISCELLANEOUS) ×2 IMPLANT
IRRIGATOR STRYKERFLOW (MISCELLANEOUS) ×3
IRRIGATOR SUCT 8 DISP DVNC XI (IRRIGATION / IRRIGATOR) IMPLANT
IRRIGATOR SUCTION 8MM XI DISP (IRRIGATION / IRRIGATOR)
IV NS IRRIG 3000ML ARTHROMATIC (IV SOLUTION) ×3 IMPLANT
KIT PINK PAD W/HEAD ARE REST (MISCELLANEOUS) ×3
KIT PINK PAD W/HEAD ARM REST (MISCELLANEOUS) ×2 IMPLANT
KIT TURNOVER KIT A (KITS) ×3 IMPLANT
LABEL OR SOLS (LABEL) ×3 IMPLANT
MANIFOLD NEPTUNE II (INSTRUMENTS) ×3 IMPLANT
NEEDLE HYPO 22GX1.5 SAFETY (NEEDLE) ×3 IMPLANT
NEEDLE INSUFFLATION 14GA 120MM (NEEDLE) IMPLANT
NS IRRIG 500ML POUR BTL (IV SOLUTION) ×3 IMPLANT
PACK LAP CHOLECYSTECTOMY (MISCELLANEOUS) ×3 IMPLANT
PENCIL ELECTRO HAND CTR (MISCELLANEOUS) IMPLANT
POUCH SPECIMEN RETRIEVAL 10MM (ENDOMECHANICALS) ×3 IMPLANT
SEAL CANN UNIV 5-8 DVNC XI (MISCELLANEOUS) ×8 IMPLANT
SEAL XI 5MM-8MM UNIVERSAL (MISCELLANEOUS) ×4
SEALER VESSEL DA VINCI XI (MISCELLANEOUS) ×1
SEALER VESSEL EXT DVNC XI (MISCELLANEOUS) ×2 IMPLANT
SET TUBE SMOKE EVAC HIGH FLOW (TUBING) ×3 IMPLANT
SOLUTION ELECTROLUBE (MISCELLANEOUS) ×3 IMPLANT
SUT MNCRL 4-0 (SUTURE) ×6
SUT MNCRL 4-0 27XMFL (SUTURE) ×4
SUT VICRYL 0 AB UR-6 (SUTURE) ×3 IMPLANT
SUTURE MNCRL 4-0 27XMF (SUTURE) ×4 IMPLANT
TROCAR Z-THREAD FIOS 11X100 BL (TROCAR) ×3 IMPLANT

## 2020-09-24 NOTE — Op Note (Signed)
Robotic cholecystectomy  Pre-operative Diagnosis: Chronic calculus cholecystitis  Post-operative Diagnosis:  Same.  Procedure: Robotic assisted laparoscopic cholecystectomy.  Surgeon: Campbell Lerner, M.D., FACS  Anesthesia: General. with endotracheal tube  Findings: Extensive biliary sludge, no stones appreciated.  Estimated Blood Loss: 150 mL         Drains: None         Specimens: Gallbladder           Complications: none  Procedure Details  The patient was seen again in the Holding Room.  2.5 mg dose of ICG was administered intravenously.  The benefits, complications, treatment options, risks and expected outcomes were discussed with the patient. The likelihood of improving the patient's symptoms with return to their baseline status is good.  The patient and/or family concurred with the proposed plan, giving informed consent, again alternatives reviewed.  The patient was taken to Operating Room, identified, and the procedure verified as robotic assisted laparoscopic cholecystectomy.  Prior to the induction of general anesthesia, antibiotic prophylaxis was administered. VTE prophylaxis was in place. General endotracheal anesthesia was then administered and tolerated well. The patient was positioned in the supine position.  After the induction, the abdomen was prepped with Chloraprep and draped in the sterile fashion.  A Time Out was held and the above information confirmed.  Right infra-umbilical local infiltration with quarter percent Marcaine with epinephrine is utilized.  Made a 12 mm incision on the left periumbilical site, I advanced an optical 13mm port under direct visualization into the peritoneal cavity.  Once the peritoneum was penetrated, insufflation was transferred.  The trocar was then advanced into the abdominal cavity under direct visualization. Pneumoperitoneum was then continued with CO2 at 14 mmHg or less and tolerated well without any adverse changes in the  patient's vital signs.  Two 8.5-mm ports were placed in the left lower quadrant and laterally, and one to the right lower quadrant, all under direct vision. All skin incisions  were infiltrated with a local anesthetic agent before making the incision and placing the trocars.  The patient was positioned  in reverse Trendelenburg, tilted the patient's left side down.  Da Vinci XI robot was then positioned on to the patient's left side, and docked.  The gallbladder was identified, it was thin-walled and more lateral than usual, the fundus grasped via the arm 4 Prograsp and retracted cephalad.  I initiated by dividing some of the lateral peritoneal attachments to the liver along the body and fundus.  Adhesions were lysed with scissors and cautery.  I initiated dissection on the right lateral aspect of the gallbladder neck, mobilizing the gallbladder neck identifying the cystic duct.  However it appears there was a cystic arterial variant, which was inadvertently injured, providing [place] with some unanticipated/premature bleeding.  I attempted placement of clips on the poorly isolated artery.  I felt it prudent to utilize the vessel sealer to obtain control.  Control was obtained.  It was sealed well, and in multiple areas. The infundibulum was then grasped and retracted laterally, exposing the medial aspect of the peritoneum overlying the triangle of Calot. This was then opened and dissected using cautery & scissors. An extended critical view of the cystic duct was obtained, aided by the ICG via FireFly which enabled ready visualization of the ductal anatomy.  The cystic artery was not noted, and I suspect this explained the anatomic variation previously encountered. The cystic duct was clearly identified and dissected to isolation.  The cystic duct was triple clipped and divided  with scissors, leaving two on the remaining stump.  Cautious initial dissection of the gallbladder neck on the medial aspect was completed  mobilizing the gallbladder, however the cystic artery in its usual location was not found.     The gallbladder was taken from the gallbladder fossa in a retrograde fashion with the electrocautery dissecting with the scissors.  The gallbladder was thin-walled and opened in multiple areas spilling a dark sludge bile.  The gallbladder was removed and placed in an Endocatch bag.  The liver bed is inspected. Hemostasis was confirmed.  The robot was undocked and moved away from the operative field. Multi volumes of normal saline solution, approximately 1.5 L of irrigation was utilized and was aspirated clear.  The gallbladder and Endocatch sac were then removed through the infraumbilical port site.   Inspection of the right upper quadrant was performed. No bleeding, bile duct injury or leak, or bowel injury was noted. The infra-umbilical port site fascia was closed with interrumpted 0 Vicryl sutures using PMI/cone under direct visualization. Pneumoperitoneum was released and ports removed.  4-0 subcuticular Monocryl was used to close the skin. Dermabond was  applied.  The patient was then extubated and brought to the recovery room in stable condition. Sponge, lap, and needle counts were correct at closure and at the conclusion of the case.               Campbell Lerner, M.D., [facility] 09/24/2020 3:12 PM

## 2020-09-24 NOTE — Anesthesia Procedure Notes (Signed)
Procedure Name: Intubation °Performed by: Fletcher-Harrison, Masiyah Engen, CRNA °Pre-anesthesia Checklist: Patient identified, Emergency Drugs available, Suction available and Patient being monitored °Patient Re-evaluated:Patient Re-evaluated prior to induction °Oxygen Delivery Method: Circle system utilized °Preoxygenation: Pre-oxygenation with 100% oxygen °Induction Type: IV induction °Ventilation: Mask ventilation without difficulty °Tube type: Oral °Number of attempts: 1 °Airway Equipment and Method: Stylet and Oral airway °Placement Confirmation: ETT inserted through vocal cords under direct vision,  positive ETCO2,  breath sounds checked- equal and bilateral and CO2 detector °Secured at: 21 cm °Tube secured with: Tape °Dental Injury: Teeth and Oropharynx as per pre-operative assessment  ° ° ° ° ° ° °

## 2020-09-24 NOTE — Transfer of Care (Signed)
Immediate Anesthesia Transfer of Care Note  Patient: Christy Stanton  Procedure(s) Performed: Procedure(s): XI ROBOTIC ASSISTED LAPAROSCOPIC CHOLECYSTECTOMY (N/A) INDOCYANINE GREEN FLUORESCENCE IMAGING (ICG)  Patient Location: PACU  Anesthesia Type:General  Level of Consciousness: sedated  Airway & Oxygen Therapy: Patient Spontanous Breathing and Patient connected to face mask oxygen  Post-op Assessment: Report given to RN and Post -op Vital signs reviewed and stable  Post vital signs: Reviewed and stable  Last Vitals:  Vitals:   09/24/20 1143 09/24/20 1522  BP: 126/74 121/82  Pulse: 87 85  Resp: 16 17  Temp: (!) 36.1 C (!) 36.3 C  SpO2: 97% 100%    Complications: No apparent anesthesia complications

## 2020-09-24 NOTE — Anesthesia Preprocedure Evaluation (Addendum)
Anesthesia Evaluation  Patient identified by MRN, date of birth, ID band Patient awake    Reviewed: Allergy & Precautions, NPO status , Patient's Chart, lab work & pertinent test results  History of Anesthesia Complications Negative for: history of anesthetic complications  Airway Mallampati: II  TM Distance: >3 FB Neck ROM: Full    Dental  (+) Teeth Intact   Pulmonary neg pulmonary ROS, neg sleep apnea, neg COPD, Patient abstained from smoking.Not current smoker,    Pulmonary exam normal breath sounds clear to auscultation       Cardiovascular Exercise Tolerance: Poor METShypertension, +CHF  (-) CAD and (-) Past MI (-) dysrhythmias + Cardiac Defibrillator  Rhythm:Regular Rate:Normal - Systolic murmurs Not pacer dependent S/p mitral valve repair 2015  MODERATE LV SYSTOLIC DYSFUNCTION (See above)  NORMAL RIGHT VENTRICULAR SYSTOLIC FUNCTION  MILD VALVULAR REGURGITATION (See above)  NO VALVULAR STENOSIS  TRIVIAL MR, PR  MILD TR  EF 25-30%     Neuro/Psych negative neurological ROS  negative psych ROS   GI/Hepatic GERD  Controlled,(+)     (-) substance abuse  ,   Endo/Other  neg diabetesHypothyroidism   Renal/GU negative Renal ROS     Musculoskeletal   Abdominal   Peds  Hematology   Anesthesia Other Findings Past Medical History: No date: Arthritis No date: CHF (congestive heart failure) (HCC) No date: Chronic systolic heart failure (HCC) No date: Dyspnea     Comment:  with exertion No date: Family history of adverse reaction to anesthesia     Comment:  mom-n/v No date: GERD (gastroesophageal reflux disease) No date: History of kidney stones No date: Hyperlipidemia No date: Hypertension No date: Hypothyroidism No date: Seizure (HCC)     Comment:  as an infant x2   Reproductive/Obstetrics                            Anesthesia Physical Anesthesia Plan  ASA: III  Anesthesia  Plan: General   Post-op Pain Management:    Induction: Intravenous  PONV Risk Score and Plan: 4 or greater and Ondansetron, Dexamethasone and Treatment may vary due to age or medical condition  Airway Management Planned: Oral ETT  Additional Equipment: None  Intra-op Plan:   Post-operative Plan: Extubation in OR  Informed Consent: I have reviewed the patients History and Physical, chart, labs and discussed the procedure including the risks, benefits and alternatives for the proposed anesthesia with the patient or authorized representative who has indicated his/her understanding and acceptance.     Dental advisory given  Plan Discussed with: CRNA and Surgeon  Anesthesia Plan Comments: (Discussed risks of anesthesia with patient, including PONV, sore throat, lip/dental damage. Rare risks discussed as well, such as cardiorespiratory and neurological sequelae. Patient understands. Patient counseled on being higher risk for anesthesia due to comorbidities: HFrEF. Patient was told about increased risk of cardiac and respiratory events, including death. Patient understands. )        Anesthesia Quick Evaluation

## 2020-09-24 NOTE — Discharge Instructions (Signed)

## 2020-09-24 NOTE — Interval H&P Note (Signed)
History and Physical Interval Note:  09/24/2020 1:45 PM  Christy Stanton  has presented today for surgery, with the diagnosis of CHRONIC CALCULOUS CHOLECYSTITIS K80.0.  The various methods of treatment have been discussed with the patient and family. After consideration of risks, benefits and other options for treatment, the patient has consented to  Procedure(s): XI ROBOTIC ASSISTED LAPAROSCOPIC CHOLECYSTECTOMY (N/A) as a surgical intervention.  The patient's history has been reviewed, patient examined, no change in status, stable for surgery.  I have reviewed the patient's chart and labs.  Questions were answered to the patient's satisfaction.     Campbell Lerner  Campbell Lerner, M.D., [facility]  09/24/2020 ; 1:45 PM

## 2020-09-25 NOTE — Anesthesia Postprocedure Evaluation (Signed)
Anesthesia Post Note  Patient: Christy Stanton  Procedure(s) Performed: XI ROBOTIC ASSISTED LAPAROSCOPIC CHOLECYSTECTOMY (N/A Abdomen) INDOCYANINE GREEN FLUORESCENCE IMAGING (ICG)  Patient location during evaluation: PACU Anesthesia Type: General Level of consciousness: awake and alert and oriented Pain management: pain level controlled Vital Signs Assessment: post-procedure vital signs reviewed and stable Respiratory status: spontaneous breathing Cardiovascular status: blood pressure returned to baseline Anesthetic complications: no   No complications documented.   Last Vitals:  Vitals:   09/24/20 1632 09/24/20 1645  BP: 120/67 113/68  Pulse: 77 77  Resp: 16 16  Temp: (!) 36.3 C 36.6 C  SpO2: 93% 96%    Last Pain:  Vitals:   09/24/20 1645  TempSrc: Tympanic  PainSc: 1                  Waylen Depaolo

## 2020-09-28 LAB — SURGICAL PATHOLOGY

## 2020-10-05 ENCOUNTER — Encounter: Payer: Self-pay | Admitting: Surgery

## 2020-10-05 ENCOUNTER — Other Ambulatory Visit: Payer: Self-pay

## 2020-10-05 ENCOUNTER — Ambulatory Visit (INDEPENDENT_AMBULATORY_CARE_PROVIDER_SITE_OTHER): Payer: Medicare PPO | Admitting: Surgery

## 2020-10-05 VITALS — BP 108/74 | HR 76 | Temp 97.7°F | Ht 62.0 in | Wt 164.4 lb

## 2020-10-05 DIAGNOSIS — Z9049 Acquired absence of other specified parts of digestive tract: Secondary | ICD-10-CM

## 2020-10-05 NOTE — Progress Notes (Signed)
[facility] POST-OP OFFICE VISIT  10/05/2020  HPI: Christy Stanton is a 75 y.o. female 11 days s/p robotic cholecystectomy.  She reports fixing her mild constipation with her regular MiraLAX.  Reports normal bowel activity since.  Has not had any pain since her surgery, and has not taken any pain medications.  She denies any redness or drainage from her incisions.  No history of nausea, vomiting, fevers or chills.  Vital signs: BP 108/74   Pulse 76   Temp 97.7 F (36.5 C) (Oral)   Ht 5\' 2"  (1.575 m)   Wt 164 lb 6.4 oz (74.6 kg)   SpO2 94%   BMI 30.07 kg/m    Physical Exam: Constitutional: She appears well in good spirits. Abdomen: Soft and nontender. Skin: All incisions are clean dry and intact with Dermabond in place.  Assessment/Plan: This is a 75 y.o. female 11 days s/p robotic cholecystectomy, doing great.  There are no problems to display for this patient.   -Continue to avoid any heavy lifting for the next month, may resume driving.  May follow-up as needed.   61 M.D., FACS 10/05/2020, 3:13 PM

## 2020-10-05 NOTE — Patient Instructions (Signed)
If you have any concerns or questions, please feel free to call our office.    High-Fiber Eating Plan Fiber, also called dietary fiber, is a type of carbohydrate. It is found foods such as fruits, vegetables, whole grains, and beans. A high-fiber diet can have many health benefits. Your health care provider may recommend a high-fiber diet to help:  Prevent constipation. Fiber can make your bowel movements more regular.  Lower your cholesterol.  Relieve the following conditions: ? Inflammation of veins in the anus (hemorrhoids). ? Inflammation of specific areas of the digestive tract (uncomplicated diverticulosis). ? A problem of the large intestine, also called the colon, that sometimes causes pain and diarrhea (irritable bowel syndrome, or IBS).  Prevent overeating as part of a weight-loss plan.  Prevent heart disease, type 2 diabetes, and certain cancers. What are tips for following this plan? Reading food labels  Check the nutrition facts label on food products for the amount of dietary fiber. Choose foods that have 5 grams of fiber or more per serving.  The goals for recommended daily fiber intake include: ? Men (age 42 or younger): 34-38 g. ? Men (over age 85): 28-34 g. ? Women (age 74 or younger): 25-28 g. ? Women (over age 58): 22-25 g. Your daily fiber goal is _____________ g.   Shopping  Choose whole fruits and vegetables instead of processed forms, such as apple juice or applesauce.  Choose a wide variety of high-fiber foods such as avocados, lentils, oats, and kidney beans.  Read the nutrition facts label of the foods you choose. Be aware of foods with added fiber. These foods often have high sugar and sodium amounts per serving. Cooking  Use whole-grain flour for baking and cooking.  Cook with brown rice instead of white rice. Meal planning  Start the day with a breakfast that is high in fiber, such as a cereal that contains 5 g of fiber or more per  serving.  Eat breads and cereals that are made with whole-grain flour instead of refined flour or white flour.  Eat brown rice, bulgur wheat, or millet instead of white rice.  Use beans in place of meat in soups, salads, and pasta dishes.  Be sure that half of the grains you eat each day are whole grains. General information  You can get the recommended daily intake of dietary fiber by: ? Eating a variety of fruits, vegetables, grains, nuts, and beans. ? Taking a fiber supplement if you are not able to take in enough fiber in your diet. It is better to get fiber through food than from a supplement.  Gradually increase how much fiber you consume. If you increase your intake of dietary fiber too quickly, you may have bloating, cramping, or gas.  Drink plenty of water to help you digest fiber.  Choose high-fiber snacks, such as berries, raw vegetables, nuts, and popcorn. What foods should I eat? Fruits Berries. Pears. Apples. Oranges. Avocado. Prunes and raisins. Dried figs. Vegetables Sweet potatoes. Spinach. Kale. Artichokes. Cabbage. Broccoli. Cauliflower. Green peas. Carrots. Squash. Grains Whole-grain breads. Multigrain cereal. Oats and oatmeal. Brown rice. Barley. Bulgur wheat. Millet. Quinoa. Bran muffins. Popcorn. Rye wafer crackers. Meats and other proteins Navy beans, kidney beans, and pinto beans. Soybeans. Split peas. Lentils. Nuts and seeds. Dairy Fiber-fortified yogurt. Beverages Fiber-fortified soy milk. Fiber-fortified orange juice. Other foods Fiber bars. The items listed above may not be a complete list of recommended foods and beverages. Contact a dietitian for more information. What foods  should I avoid? Fruits Fruit juice. Cooked, strained fruit. Vegetables Fried potatoes. Canned vegetables. Well-cooked vegetables. Grains White bread. Pasta made with refined flour. White rice. Meats and other proteins Fatty cuts of meat. Fried chicken or fried  fish. Dairy Milk. Yogurt. Cream cheese. Sour cream. Fats and oils Butters. Beverages Soft drinks. Other foods Cakes and pastries. The items listed above may not be a complete list of foods and beverages to avoid. Talk with your dietitian about what choices are best for you. Summary  Fiber is a type of carbohydrate. It is found in foods such as fruits, vegetables, whole grains, and beans.  A high-fiber diet has many benefits. It can help to prevent constipation, lower blood cholesterol, aid weight loss, and reduce your risk of heart disease, diabetes, and certain cancers.  Increase your intake of fiber gradually. Increasing fiber too quickly may cause cramping, bloating, and gas. Drink plenty of water while you increase the amount of fiber you consume.  The best sources of fiber include whole fruits and vegetables, whole grains, nuts, seeds, and beans. This information is not intended to replace advice given to you by your health care provider. Make sure you discuss any questions you have with your health care provider. Document Revised: 10/30/2019 Document Reviewed: 10/30/2019 Elsevier Patient Education  2021 Elsevier Inc.     Gallbladder Eating Plan If you have a gallbladder condition, you may have trouble digesting fats. Eating a low-fat diet can help reduce your symptoms, and may be helpful before and after having surgery to remove your gallbladder (cholecystectomy). Your health care provider may recommend that you work with a diet and nutrition specialist (dietitian) to help you reduce the amount of fat in your diet. What are tips for following this plan? General guidelines  Limit your fat intake to less than 30% of your total daily calories. If you eat around 1,800 calories each day, this is less than 60 grams (g) of fat per day.  Fat is an important part of a healthy diet. Eating a low-fat diet can make it hard to maintain a healthy body weight. Ask your dietitian how much  fat, calories, and other nutrients you need each day.  Eat small, frequent meals throughout the day instead of three large meals.  Drink at least 8-10 cups of fluid a day. Drink enough fluid to keep your urine clear or pale yellow.  Limit alcohol intake to no more than 1 drink a day for nonpregnant women and 2 drinks a day for men. One drink equals 12 oz of beer, 5 oz of wine, or 1 oz of hard liquor. Reading food labels  Check Nutrition Facts on food labels for the amount of fat per serving. Choose foods with less than 3 grams of fat per serving.   Shopping  Choose nonfat and low-fat healthy foods. Look for the words "nonfat," "low fat," or "fat free."  Avoid buying processed or prepackaged foods. Cooking  Cook using low-fat methods, such as baking, broiling, grilling, or boiling.  Cook with small amounts of healthy fats, such as olive oil, grapeseed oil, canola oil, or sunflower oil. What foods are recommended?  All fresh, frozen, or canned fruits and vegetables.  Whole grains.  Low-fat or non-fat (skim) milk and yogurt.  Lean meat, skinless poultry, fish, eggs, and beans.  Low-fat protein supplement powders or drinks.  Spices and herbs. What foods are not recommended?  High-fat foods. These include baked goods, fast food, fatty cuts of meat, ice cream,  french toast, sweet rolls, pizza, cheese bread, foods covered with butter, creamy sauces, or cheese.  Fried foods. These include french fries, tempura, battered fish, breaded chicken, fried breads, and sweets.  Foods with strong odors.  Foods that cause bloating and gas. Summary  A low-fat diet can be helpful if you have a gallbladder condition, or before and after gallbladder surgery.  Limit your fat intake to less than 30% of your total daily calories. This is about 60 g of fat if you eat 1,800 calories each day.  Eat small, frequent meals throughout the day instead of three large meals. This information is not  intended to replace advice given to you by your health care provider. Make sure you discuss any questions you have with your health care provider. Document Revised: 02/12/2020 Document Reviewed: 02/12/2020 Elsevier Patient Education  2021 Elsevier Inc.   GENERAL POST-OPERATIVE PATIENT INSTRUCTIONS   WOUND CARE INSTRUCTIONS:  Keep a dry clean dressing on the wound if there is drainage. The initial bandage may be removed after 24 hours.  Once the wound has quit draining you may leave it open to air.  If clothing rubs against the wound or causes irritation and the wound is not draining you may cover it with a dry dressing during the daytime.  Try to keep the wound dry and avoid ointments on the wound unless directed to do so.  If the wound becomes bright red and painful or starts to drain infected material that is not clear, please contact your physician immediately.  If the wound is mildly pink and has a thick firm ridge underneath it, this is normal, and is referred to as a healing ridge.  This will resolve over the next 4-6 weeks.  BATHING: You may shower if you have been informed of this by your surgeon. However, Please do not submerge in a tub, hot tub, or pool until incisions are completely sealed or have been told by your surgeon that you may do so.  DIET:  You may eat any foods that you can tolerate.  It is a good idea to eat a high fiber diet and take in plenty of fluids to prevent constipation.  If you do become constipated you may want to take a mild laxative or take ducolax tablets on a daily basis until your bowel habits are regular.  Constipation can be very uncomfortable, along with straining, after recent surgery.  ACTIVITY:  You are encouraged to cough and deep breath or use your incentive spirometer if you were given one, every 15-30 minutes when awake.  This will help prevent respiratory complications and low grade fevers post-operatively if you had a general anesthetic.  You may want  to hug a pillow when coughing and sneezing to add additional support to the surgical area, if you had abdominal or chest surgery, which will decrease pain during these times.  You are encouraged to walk and engage in light activity for the next two weeks.  You should not lift more than 10-20 pounds, until 10/29/2020 as it could put you at increased risk for complications.  Twenty pounds is roughly equivalent to a plastic bag of groceries. At that time- Listen to your body when lifting, if you have pain when lifting, stop and then try again in a few days. Soreness after doing exercises or activities of daily living is normal as you get back in to your normal routine.  MEDICATIONS:  Try to take narcotic medications and anti-inflammatory medications, such as  tylenol, ibuprofen, naprosyn, etc., with food.  This will minimize stomach upset from the medication.  Should you develop nausea and vomiting from the pain medication, or develop a rash, please discontinue the medication and contact your physician.  You should not drive, make important decisions, or operate machinery when taking narcotic pain medication.  SUNBLOCK Use sun block to incision area over the next year if this area will be exposed to sun. This helps decrease scarring and will allow you avoid a permanent darkened area over your incision.

## 2022-05-31 ENCOUNTER — Other Ambulatory Visit
Admission: RE | Admit: 2022-05-31 | Discharge: 2022-05-31 | Disposition: A | Discharge status: Home / Self Care | Payer: Medicare PPO | Source: Ambulatory Visit | Attending: Physician Assistant | Admitting: Physician Assistant

## 2022-05-31 DIAGNOSIS — I5022 Chronic systolic (congestive) heart failure: Secondary | ICD-10-CM | POA: Diagnosis present

## 2022-05-31 DIAGNOSIS — R0609 Other forms of dyspnea: Secondary | ICD-10-CM | POA: Insufficient documentation

## 2022-05-31 LAB — BRAIN NATRIURETIC PEPTIDE: B Natriuretic Peptide: 85 pg/mL (ref 0.0–100.0)

## 2022-06-06 IMAGING — US US ABDOMEN LIMITED RUQ/ASCITES
1 series · 14 of 20 positions shown · non-contrast
Comparison: None.

CLINICAL DATA: Right upper quadrant pain x2 days.

EXAM:
ULTRASOUND ABDOMEN LIMITED RIGHT UPPER QUADRANT

[Series 1: us abdomen limited ruq (liver/gb) · 14 of 20 slices shown]
[im 1/20]
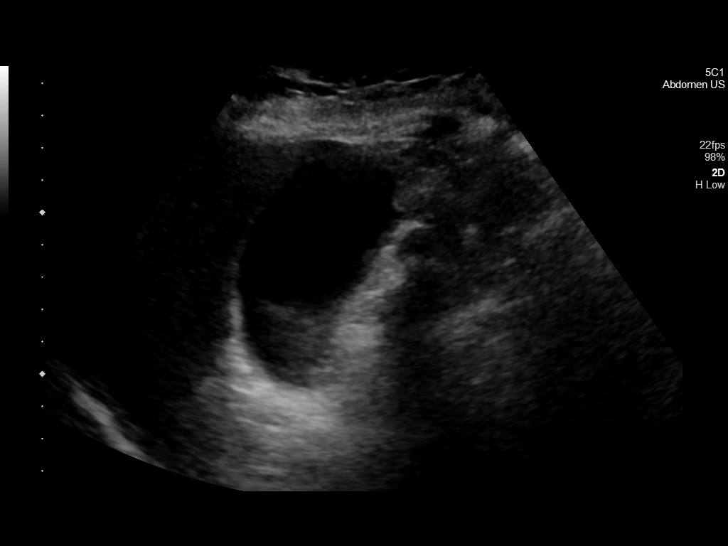
[im 3/20]
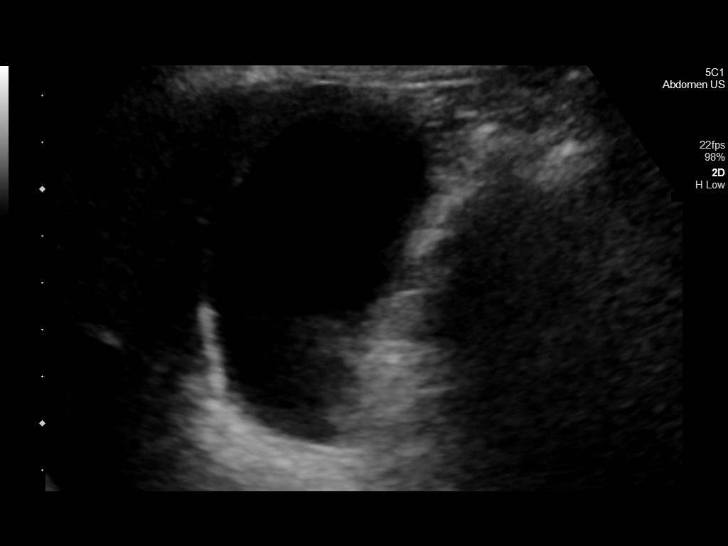
[im 4/20]
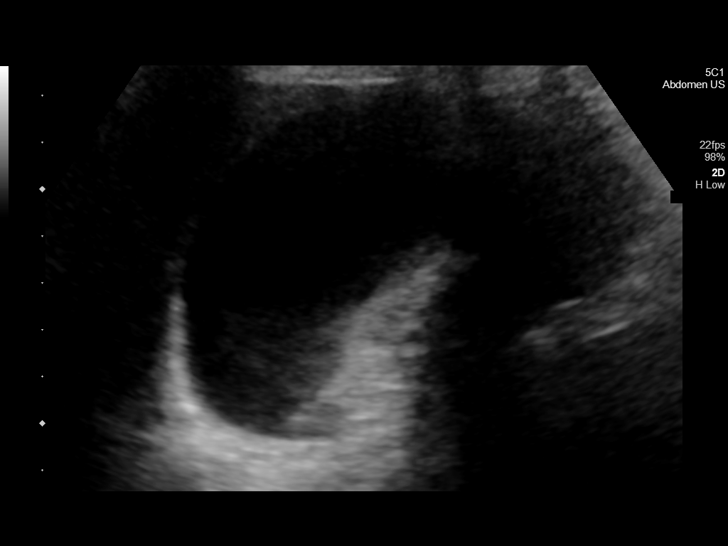
[im 6/20]
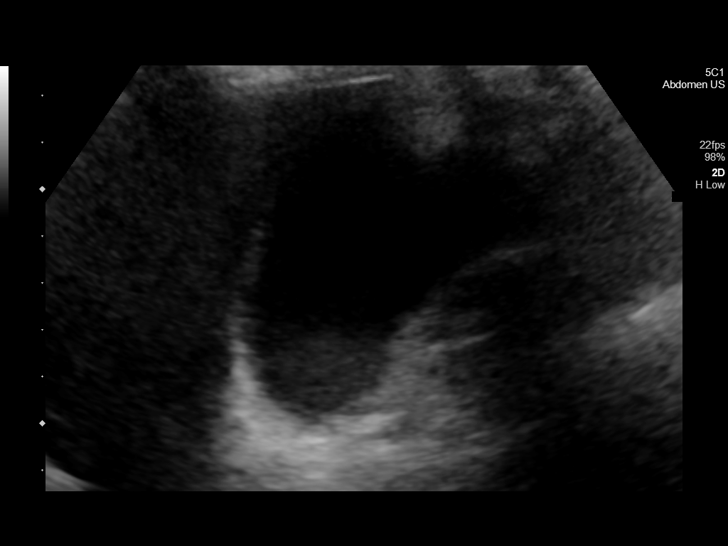
[im 7/20]
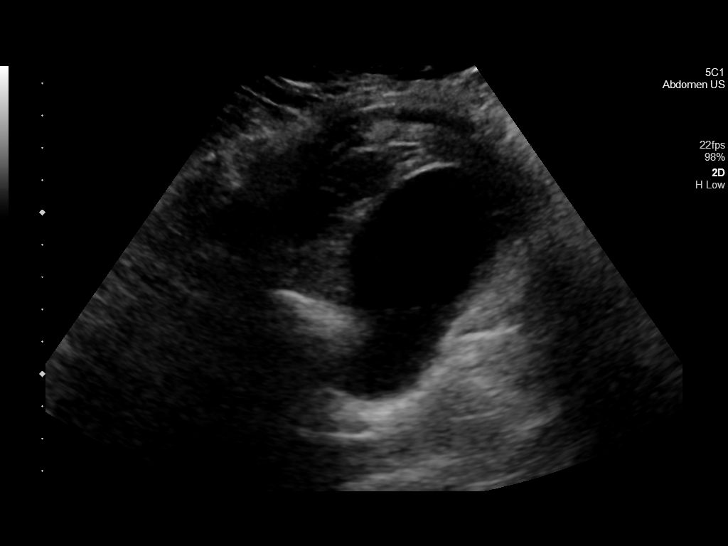
[im 8/20]
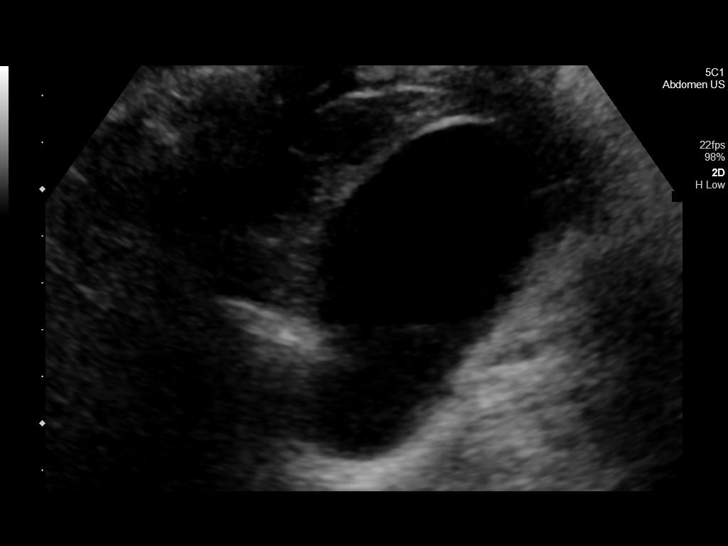
[im 10/20]
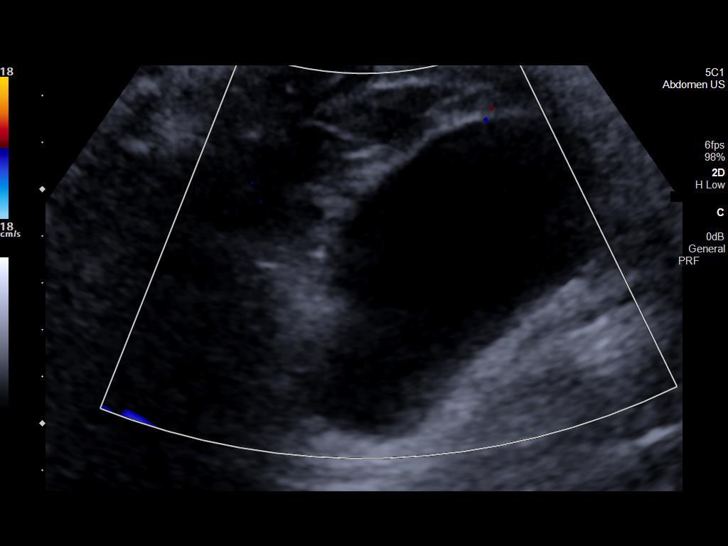
[im 11/20]
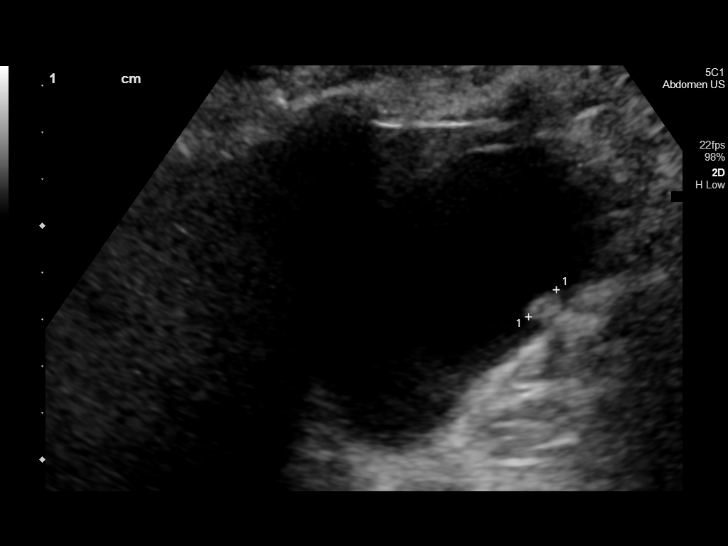
[im 13/20]
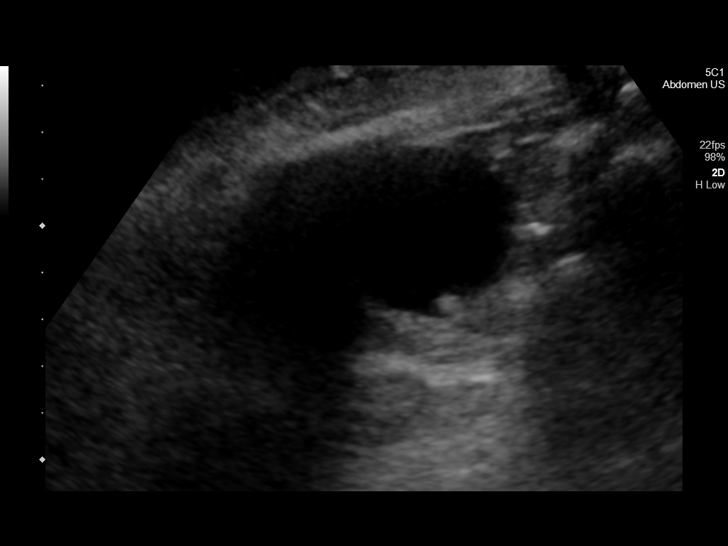
[im 14/20]
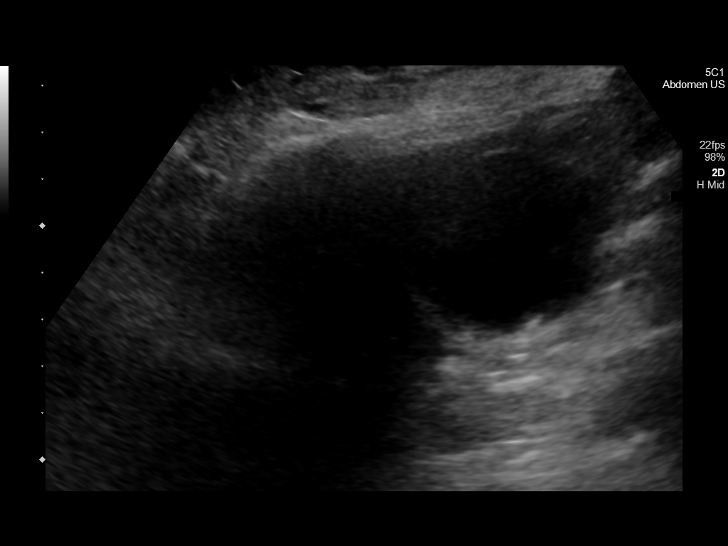
[im 16/20]
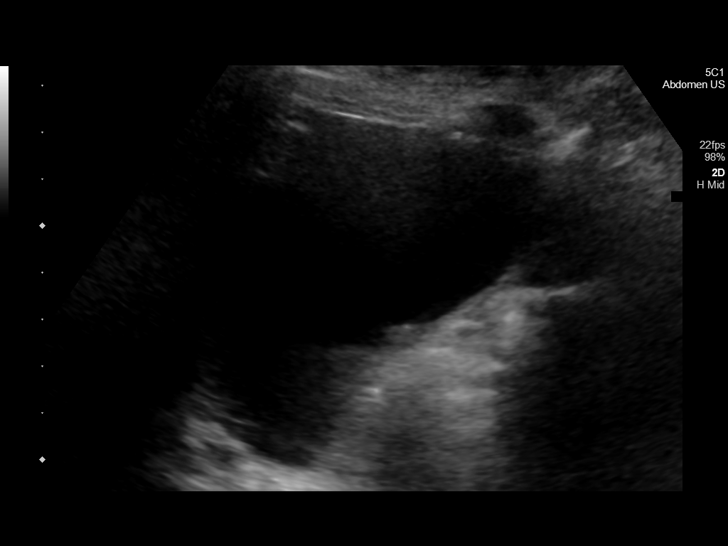
[im 17/20]
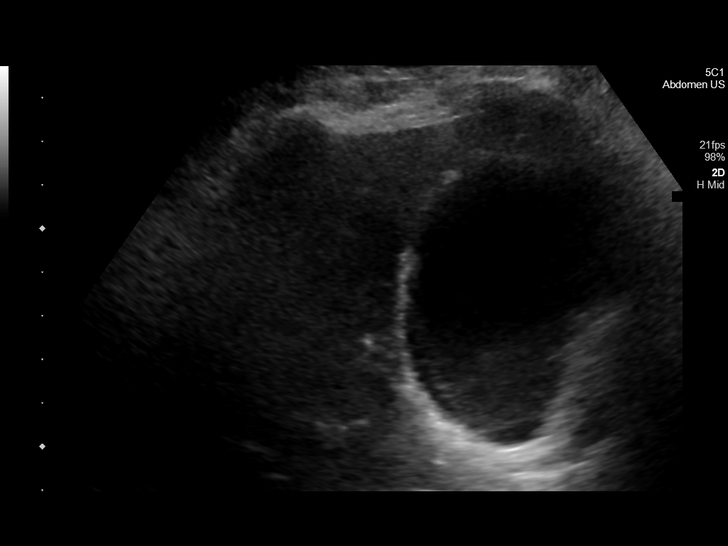
[im 18/20]
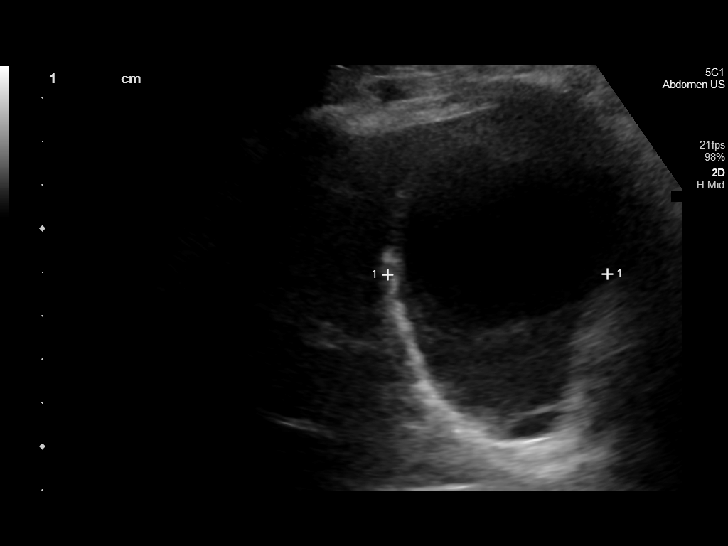
[im 20/20]
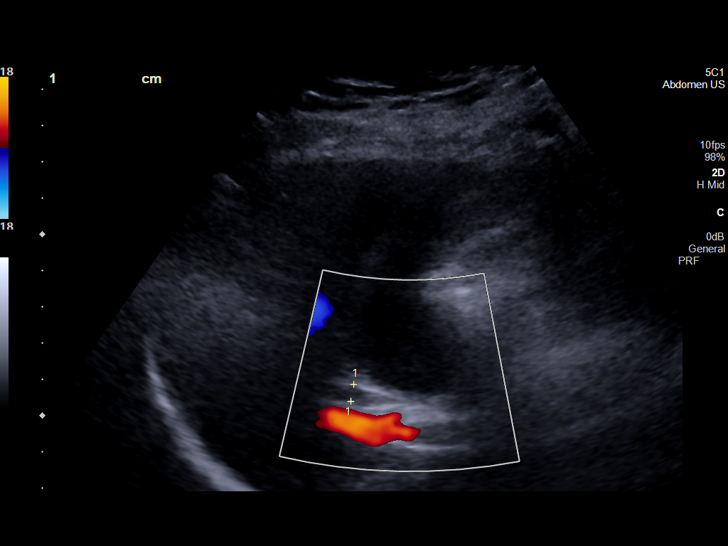

[14 of 20 positions shown; findings below may reference images not displayed]

FINDINGS: Gallbladder:

Echogenic sludge within the gallbladder lumen. An 8 mm shadowing,
echogenic gallstone is also present. There is no evidence of
gallbladder wall thickening (2.2 mm) or pericholecystic fluid. No
sonographic Murphy sign noted by sonographer.

Common bile duct:

Diameter: 4.8 mm

Liver:

No focal lesion identified. Diffusely increased echogenicity of the
liver parenchyma is noted. Portal vein is patent on color Doppler
imaging with normal direction of blood flow towards the liver.

Other: None.
IMPRESSION: 1. Cholelithiasis and gallbladder sludge without evidence of acute
cholecystitis.
2. Fatty liver.

## 2022-08-30 ENCOUNTER — Ambulatory Visit: Payer: Medicare PPO | Admitting: Urology

## 2022-08-30 VITALS — BP 94/63 | HR 75 | Ht 62.0 in | Wt 162.5 lb

## 2022-08-30 DIAGNOSIS — N393 Stress incontinence (female) (male): Secondary | ICD-10-CM

## 2022-08-30 DIAGNOSIS — N39498 Other specified urinary incontinence: Secondary | ICD-10-CM

## 2022-08-30 DIAGNOSIS — N3281 Overactive bladder: Secondary | ICD-10-CM

## 2022-08-30 DIAGNOSIS — N3946 Mixed incontinence: Secondary | ICD-10-CM | POA: Diagnosis not present

## 2022-08-30 DIAGNOSIS — N3941 Urge incontinence: Secondary | ICD-10-CM

## 2022-08-30 LAB — MICROSCOPIC EXAMINATION

## 2022-08-30 LAB — URINALYSIS, COMPLETE
Bilirubin, UA: NEGATIVE
Glucose, UA: NEGATIVE
Ketones, UA: NEGATIVE
Leukocytes,UA: NEGATIVE
Nitrite, UA: NEGATIVE
RBC, UA: NEGATIVE
Specific Gravity, UA: >1.03 — ABNORMAL HIGH (ref 1.005–1.030)
Urobilinogen, Ur: 1 mg/dL (ref 0.2–1.0)
pH, UA: 5 (ref 5.0–7.5)

## 2022-08-30 LAB — BLADDER SCAN AMB NON-IMAGING: Scan Result: 0

## 2022-08-30 MED ORDER — SOLIFENACIN SUCCINATE 10 MG PO TABS: 10.0000 mg | ORAL_TABLET | Freq: Every day | ORAL | 11 refills | Status: DC

## 2022-08-30 NOTE — Patient Instructions (Signed)
Kegel Exercises  Kegel exercises can help strengthen your pelvic floor muscles. The pelvic floor is a group of muscles that support your rectum, small intestine, and bladder. In females, pelvic floor muscles also help support the uterus. These muscles help you control the flow of urine and stool (feces). Kegel exercises are painless and simple. They do not require any equipment. Your provider may suggest Kegel exercises to: Improve bladder and bowel control. Improve sexual response. Improve weak pelvic floor muscles after surgery to remove the uterus (hysterectomy) or after pregnancy, in females. Improve weak pelvic floor muscles after prostate gland removal or surgery, in males. Kegel exercises involve squeezing your pelvic floor muscles. These are the same muscles you squeeze when you try to stop the flow of urine or keep from passing gas. The exercises can be done while sitting, standing, or lying down, but it is best to vary your position. Ask your health care provider which exercises are safe for you. Do exercises exactly as told by your health care provider and adjust them as directed. Do not begin these exercises until told by your health care provider. Exercises How to do Kegel exercises: Squeeze your pelvic floor muscles tight. You should feel a tight lift in your rectal area. If you are a female, you should also feel a tightness in your vaginal area. Keep your stomach, buttocks, and legs relaxed. Hold the muscles tight for up to 10 seconds. Breathe normally. Relax your muscles for up to 10 seconds. Repeat as told by your health care provider. Repeat this exercise daily as told by your health care provider. Continue to do this exercise for at least 4-6 weeks, or for as long as told by your health care provider. You may be referred to a physical therapist who can help you learn more about how to do Kegel exercises. Depending on your condition, your health care provider may  recommend: Varying how long you squeeze your muscles. Doing several sets of exercises every day. Doing exercises for several weeks. Making Kegel exercises a part of your regular exercise routine. This information is not intended to replace advice given to you by your health care provider. Make sure you discuss any questions you have with your health care provider. Document Revised: 11/04/2020 Document Reviewed: 11/04/2020 Elsevier Patient Education  2023 Elsevier Inc.  

## 2022-08-30 NOTE — Progress Notes (Signed)
Christy Stanton,acting as a scribe for Christy Espy, MD.,have documented all relevant documentation on the behalf of Christy Espy, MD,as directed by  Christy Espy, MD while in the presence of Christy Espy, MD.  08/30/2022 9:26 AM   Christy Stanton [birth year] [id number]    Chief Complaint  Patient presents with   Urinary Incontinence    HPI:  77 year-old female who presents today for further evaluation of urinary incontinence. She was last seen in 2020 to establish care for history of stones. Her visit today is unrelated as she has not had any stone events in the interim.   Today, it does not appear that she is on any medications for urgency or urge incontinence. When she made this appointment, she was experiencing urgency, frequency, and burning. She has seen her PCP since then and was given antibiotics. The burning symptom has improved.   Her urgency/frequency symptoms persist and have been going on for at least a few years. She denies any nocturia or bulging in her vagina. She reports mild stress incontinence that has become more frequent with age. She wears a liner.  She reports that when she first wakes up in the morning, she puts her feet on the ground and cannot get to the bathroom in time.  This is also progressive daytime symptoms including when she returns home when she puts her key in the door, can get to the bathroom on time.  She does not also have chronic laughing coughing sneezing incontinence episodes but these are mild and nonbothersome.  They have also been going on for years.  She notes that she had several UTI's last year but only 1 this year.   She reports occasional constipation seemingly related to certain medications.   Denies any vaginal symptoms.  She drinks primarily water, occasionally tea.  She is contemplating setting alarm to get up in the early morning hours  Never tried any medications for OAB.  Results for orders placed or performed in  visit on 08/30/22  Bladder Scan (Post Void Residual) in office  Result Value Ref Range   Scan Result 0 ml      PMH: Past Medical History:  Diagnosis Date   Arthritis    CHF (congestive heart failure) (HCC)    Chronic systolic heart failure (HCC)    Dyspnea    with exertion   Family history of adverse reaction to anesthesia    mom-n/v   GERD (gastroesophageal reflux disease)    History of kidney stones    Hyperlipidemia    Hypertension    Hypothyroidism    Seizure ([place])    as an infant x2     Surgical History: Past Surgical History:  Procedure Laterality Date   CARDIAC SURGERY     CHOLECYSTECTOMY     MITRAL VALVE REPAIR  09/15/2013   PACEMAKER IMPLANT  2015   TRANSESOPHAGEAL ECHOCARDIOGRAPHY      Home Medications:  Allergies as of 08/30/2022       Reactions   Erythromycin Base Nausea And Vomiting   Pt states she can take this medication, but causes severe GI upset   Penicillins Nausea And Vomiting, Rash        Medication List        Accurate as of August 30, 2022  9:26 AM. If you have any questions, ask your nurse or doctor.          STOP taking these medications    omeprazole 20 MG capsule  Commonly known as: PRILOSEC   oxyCODONE 5 MG immediate release tablet Commonly known as: Oxy IR/ROXICODONE       TAKE these medications    aspirin EC 81 MG tablet Take 81 mg by mouth daily.   carvedilol 12.5 MG tablet Commonly known as: COREG Take 12.5 mg by mouth 2 (two) times daily.   furosemide 20 MG tablet Commonly known as: LASIX Take 20 mg by mouth daily as needed for fluid or edema.   ibuprofen 800 MG tablet Commonly known as: ADVIL Take 1 tablet (800 mg total) by mouth every 8 (eight) hours as needed.   levothyroxine 50 MCG tablet Commonly known as: SYNTHROID Take 50 mcg by mouth daily before breakfast.   losartan 25 MG tablet Commonly known as: COZAAR Take 25 mg by mouth every morning.   multivitamin with minerals Tabs  tablet Take 1 tablet by mouth daily.   potassium chloride 10 MEQ tablet Commonly known as: KLOR-CON Take 10 mEq by mouth daily.   simvastatin 20 MG tablet Commonly known as: ZOCOR Take 20 mg by mouth at bedtime.   solifenacin 10 MG tablet Commonly known as: VESICARE Take 1 tablet (10 mg total) by mouth daily.        Allergies:  Allergies  Allergen Reactions   Erythromycin Base Nausea And Vomiting    Pt states she can take this medication, but causes severe GI upset    Penicillins Nausea And Vomiting and Rash    Family History: Family History  Problem Relation Age of Onset   Heart disease Mother     Social History:  reports that she has never smoked. She has never used smokeless tobacco. She reports that she does not drink alcohol and does not use drugs.   Physical Exam: BP 94/63   Pulse 75   Ht 5' 2"$  (1.575 m)   Wt 162 lb 8 oz (73.7 kg)   BMI 29.72 kg/m   Constitutional:  Alert and oriented, No acute distress. HEENT: [place] AT, moist mucus membranes.  Trachea midline, no masses. Neurologic: Grossly intact, no focal deficits, moving all 4 extremities. Psychiatric: Normal mood and affect.  Urinalysis today is negative, see epic  Assessment & Plan:    1. Overactive bladder/ Urge incontinence - She has behavioral modification vs pharmacotherapy; she seems to have optimized this appropriately -We discussed the algorithm for treatment of OAB - We will start with Vesicare 10 mg, possible side effects including dry eyes, dry mouth and constipation were reviewed.. She will check her formulary to see what er coverage is. - She is emptying her bladder well. - No underlying concerning issues.   2. Stress incontinence - Relatively mild and less bothersome for her. - We gave her a handout about stress incontinence today.   Return in about 1 month (around 09/28/2022) for PVR.  I have reviewed the above documentation for accuracy and completeness, and I agree with the  above.   Christy Espy, MD   [facility] [address] [place], [place] [address] [phone]

## 2022-09-27 ENCOUNTER — Ambulatory Visit: Payer: Medicare PPO | Admitting: Physician Assistant

## 2022-09-27 VITALS — BP 123/78 | HR 77 | Ht 62.0 in | Wt 162.0 lb

## 2022-09-27 DIAGNOSIS — N3281 Overactive bladder: Secondary | ICD-10-CM | POA: Diagnosis not present

## 2022-09-27 LAB — BLADDER SCAN AMB NON-IMAGING: Scan Result: 0 ml

## 2022-09-27 MED ORDER — SOLIFENACIN SUCCINATE 10 MG PO TABS
10.0000 mg | ORAL_TABLET | Freq: Every day | ORAL | 3 refills | Status: DC
Start: 2022-09-27 — End: 2023-09-24

## 2022-09-27 NOTE — Progress Notes (Signed)
09/27/2022 9:04 AM   [name] [birth year] [id number]  CC: Chief Complaint  Patient presents with   Follow-up   HPI: Christy Stanton is a 77 y.o. female with PMH nephrolithiasis and OAB wet with mixed urge and stress incontinence who presents today for symptom recheck on Vesicare 10 mg.   Today she reports she has noticed a huge difference in her urinary symptoms on Vesicare.  Overall she describes the drug is "wonderful."  She reports complete resolution of urge incontinence and has not even noticed stress incontinence since starting the medication.  She reports some occasional dry mouth, but denies constipation and dry eye.  She is very pleased and wishes to continue the medication.  PVR 0 mL.  PMH: Past Medical History:  Diagnosis Date   Arthritis    CHF (congestive heart failure) (HCC)    Chronic systolic heart failure (HCC)    Dyspnea    with exertion   Family history of adverse reaction to anesthesia    mom-n/v   GERD (gastroesophageal reflux disease)    History of kidney stones    Hyperlipidemia    Hypertension    Hypothyroidism    Seizure ([place])    as an infant x2     Surgical History: Past Surgical History:  Procedure Laterality Date   CARDIAC SURGERY     CHOLECYSTECTOMY     MITRAL VALVE REPAIR  09/15/2013   PACEMAKER IMPLANT  2015   TRANSESOPHAGEAL ECHOCARDIOGRAPHY      Home Medications:  Allergies as of 09/27/2022       Reactions   Erythromycin Base Nausea And Vomiting   Pt states she can take this medication, but causes severe GI upset   Penicillins Nausea And Vomiting, Rash        Medication List        Accurate as of September 27, 2022  9:04 AM. If you have any questions, ask your nurse or doctor.          aspirin EC 81 MG tablet Take 81 mg by mouth daily.   carvedilol 12.5 MG tablet Commonly known as: COREG Take 12.5 mg by mouth 2 (two) times daily.   Entresto 24-26 MG Generic drug: sacubitril-valsartan SMARTSIG:1 Tablet(s)  By Mouth Every 12 Hours   furosemide 20 MG tablet Commonly known as: LASIX Take 20 mg by mouth daily as needed for fluid or edema.   ibuprofen 800 MG tablet Commonly known as: ADVIL Take 1 tablet (800 mg total) by mouth every 8 (eight) hours as needed.   levothyroxine 50 MCG tablet Commonly known as: SYNTHROID Take 50 mcg by mouth daily before breakfast.   losartan 25 MG tablet Commonly known as: COZAAR Take 25 mg by mouth every morning.   multivitamin with minerals Tabs tablet Take 1 tablet by mouth daily.   potassium chloride 10 MEQ tablet Commonly known as: KLOR-CON Take 10 mEq by mouth daily.   potassium chloride SA 20 MEQ tablet Commonly known as: KLOR-CON M Take 20 mEq by mouth daily.   simvastatin 20 MG tablet Commonly known as: ZOCOR Take 20 mg by mouth at bedtime.   solifenacin 10 MG tablet Commonly known as: VESICARE Take 1 tablet (10 mg total) by mouth daily.        Allergies:  Allergies  Allergen Reactions   Erythromycin Base Nausea And Vomiting    Pt states she can take this medication, but causes severe GI upset    Penicillins Nausea And Vomiting and  Rash    Family History: Family History  Problem Relation Age of Onset   Heart disease Mother     Social History:   reports that she has never smoked. She has never used smokeless tobacco. She reports that she does not drink alcohol and does not use drugs.  Physical Exam: BP 123/78   Pulse 77   Ht 5\' 2"  (1.575 m)   Wt 162 lb (73.5 kg)   BMI 29.63 kg/m   Constitutional:  Alert and oriented, no acute distress, nontoxic appearing HEENT: [place], AT Cardiovascular: No clubbing, cyanosis, or edema Respiratory: Normal respiratory effort, no increased work of breathing Skin: No rashes, bruises or suspicious lesions Neurologic: Grossly intact, no focal deficits, moving all 4 extremities Psychiatric: Normal mood and affect  Laboratory Data: Results for orders placed or performed in visit on  09/27/22  BLADDER SCAN AMB NON-IMAGING  Result Value Ref Range   Scan Result 0 ml   Assessment & Plan:   1. OAB (overactive bladder) Significant symptomatic improvement on Vesicare.  She is having mild anticholinergic side effects with occasional dry mouth, however this is not overly bothersome for her.  Will continue Vesicare but consider a trial of a beta 3 agonist in the future if it loses efficacy or if her side effects become intolerable.  I encouraged her to stay up-to-date with dental cleanings due to increased risk of caries in the setting of dry mouth. - BLADDER SCAN AMB NON-IMAGING - solifenacin (VESICARE) 10 MG tablet; Take 1 tablet (10 mg total) by mouth daily.  Dispense: 90 tablet; Refill: 3   Return in about 1 year (around 09/27/2023) for Annual OAB f/u with PVR.  Debroah Loop, PA-C  [facility] [address], [place], [place] [address] [phone]

## 2023-06-30 LAB — COLOGUARD: COLOGUARD: NEGATIVE

## 2023-09-24 ENCOUNTER — Ambulatory Visit: Payer: Medicare PPO | Admitting: Physician Assistant

## 2023-09-24 ENCOUNTER — Encounter: Payer: Self-pay | Admitting: Physician Assistant

## 2023-09-24 VITALS — BP 122/74 | HR 69 | Ht 61.0 in | Wt 144.0 lb

## 2023-09-24 DIAGNOSIS — N3281 Overactive bladder: Secondary | ICD-10-CM

## 2023-09-24 LAB — BLADDER SCAN AMB NON-IMAGING

## 2023-09-24 MED ORDER — TROSPIUM CHLORIDE ER 60 MG PO CP24
1.0000 | ORAL_CAPSULE | Freq: Every day | ORAL | 11 refills | Status: DC
Start: 2023-09-24 — End: 2023-11-09

## 2023-09-24 NOTE — Progress Notes (Signed)
 09/24/2023 10:06 AM   Christy Stanton Dec 07, 1945 [id number]  CC: Chief Complaint  Patient presents with   Follow-up   HPI: Christy Stanton is a 78 y.o. female with PMH nephrolithiasis and OAB wet with mixed urge and stress incontinence on Vesicare 10 mg who presents today for annual follow-up.   Today she reports significant improvement in her voiding symptoms on Vesicare, however she still has some occasional breakthrough urge incontinence.  She describes rare nocturia.  She wears 1-2 liners daily, and they are damp.  She has had some dry mouth.  PVR 1 mL.  PMH: Past Medical History:  Diagnosis Date   Arthritis    CHF (congestive heart failure) (HCC)    Chronic systolic heart failure (HCC)    Dyspnea    with exertion   Family history of adverse reaction to anesthesia    mom-n/v   GERD (gastroesophageal reflux disease)    History of kidney stones    Hyperlipidemia    Hypertension    Hypothyroidism    Seizure (HCC)    as an infant x2     Surgical History: Past Surgical History:  Procedure Laterality Date   CARDIAC SURGERY     CHOLECYSTECTOMY     MITRAL VALVE REPAIR  09/15/2013   PACEMAKER IMPLANT  2015   TRANSESOPHAGEAL ECHOCARDIOGRAPHY      Home Medications:  Allergies as of 09/24/2023       Reactions   Erythromycin Base Nausea And Vomiting   Pt states she can take this medication, but causes severe GI upset   Penicillins Nausea And Vomiting, Rash        Medication List        Accurate as of September 24, 2023 10:06 AM. If you have any questions, ask your nurse or doctor.          STOP taking these medications    solifenacin 10 MG tablet Commonly known as: VESICARE Stopped by: Carman Ching       TAKE these medications    aspirin EC 81 MG tablet Take 81 mg by mouth daily.   carvedilol 12.5 MG tablet Commonly known as: COREG Take 12.5 mg by mouth 2 (two) times daily.   Entresto 24-26 MG Generic drug:  sacubitril-valsartan SMARTSIG:1 Tablet(s) By Mouth Every 12 Hours   furosemide 20 MG tablet Commonly known as: LASIX Take 20 mg by mouth daily as needed for fluid or edema.   ibuprofen 800 MG tablet Commonly known as: ADVIL Take 1 tablet (800 mg total) by mouth every 8 (eight) hours as needed.   levothyroxine 50 MCG tablet Commonly known as: SYNTHROID Take 50 mcg by mouth daily before breakfast.   losartan 25 MG tablet Commonly known as: COZAAR Take 25 mg by mouth every morning.   multivitamin with minerals Tabs tablet Take 1 tablet by mouth daily.   potassium chloride 10 MEQ tablet Commonly known as: KLOR-CON Take 10 mEq by mouth daily.   potassium chloride SA 20 MEQ tablet Commonly known as: KLOR-CON M Take 20 mEq by mouth daily.   simvastatin 20 MG tablet Commonly known as: ZOCOR Take 20 mg by mouth at bedtime.   Trospium Chloride 60 MG Cp24 Take 1 capsule (60 mg total) by mouth daily. Started by: Carman Ching        Allergies:  Allergies  Allergen Reactions   Erythromycin Base Nausea And Vomiting    Pt states she can take this medication, but causes severe GI upset  Penicillins Nausea And Vomiting and Rash    Family History: Family History  Problem Relation Age of Onset   Heart disease Mother     Social History:   reports that she has never smoked. She has never used smokeless tobacco. She reports that she does not drink alcohol and does not use drugs.  Physical Exam: BP 122/74   Pulse 69   Ht 5\' 1"  (1.549 m)   Wt 144 lb (65.3 kg)   BMI 27.21 kg/m   Constitutional:  Alert and oriented, no acute distress, nontoxic appearing HEENT: , AT Cardiovascular: No clubbing, cyanosis, or edema Respiratory: Normal respiratory effort, no increased work of breathing Skin: No rashes, bruises or suspicious lesions Neurologic: Grossly intact, no focal deficits, moving all 4 extremities Psychiatric: Normal mood and affect  Laboratory  Data: Results for orders placed or performed in visit on 09/24/23  BLADDER SCAN AMB NON-IMAGING   Collection Time: 09/24/23  9:27 AM  Result Value Ref Range   Scan Result 1ml    Assessment & Plan:   1. OAB (overactive bladder) ([place]) Symptoms well-controlled on Vesicare, though she is having some breakthrough urge incontinence and dry mouth.  We discussed the risk of cognitive side effects and dementia with long-term anticholinergic use and I recommended switching her pharmacotherapy.  It does not look like her insurance covers either beta 3 agonist, so we will try trospium ER 60 mg and see her back in 6 weeks for symptom recheck.  She is not interested in third line therapies at this time, though may revisit this in the future. - BLADDER SCAN AMB NON-IMAGING - Trospium Chloride 60 MG CP24; Take 1 capsule (60 mg total) by mouth daily.  Dispense: 30 capsule; Refill: 11  Return in about 6 weeks (around 11/05/2023) for Symptom recheck with PVR.  Carman Ching, PA-C  [facility] [place] [address] [place], [place] [address] [phone]

## 2023-09-27 ENCOUNTER — Ambulatory Visit: Payer: Self-pay | Admitting: Physician Assistant

## 2023-11-06 ENCOUNTER — Ambulatory Visit: Admitting: Physician Assistant

## 2023-11-09 ENCOUNTER — Ambulatory Visit: Admitting: Physician Assistant

## 2023-11-09 DIAGNOSIS — N3281 Overactive bladder: Secondary | ICD-10-CM

## 2023-11-09 MED ORDER — TROSPIUM CHLORIDE ER 60 MG PO CP24: 1.0000 | ORAL_CAPSULE | Freq: Every day | ORAL | 11 refills | Status: AC

## 2023-11-09 NOTE — Progress Notes (Signed)
 11/09/2023 9:22 AM   Christy Stanton December 28, 1945 [id number]  CC: Chief Complaint  Patient presents with   Follow-up   HPI: Christy Stanton is a 78 y.o. female with PMH nephrolithiasis and OAB wet with mixed urge and stress incontinence with anticholinergic side effects on Vesicare  who presents today for symptom recheck on trospium  ER 60 mg.   Today she reports slight improvement in her symptoms since switching to trospium .  She has rare leaks and wears 1 pad daily, which is damp.  She denies constipation, dry mouth, or dry eye.  Overall she is pleased with the medication and wishes to continue it.  PVR 0 mL.  PMH: Past Medical History:  Diagnosis Date   Arthritis    CHF (congestive heart failure) (HCC)    Chronic systolic heart failure (HCC)    Dyspnea    with exertion   Family history of adverse reaction to anesthesia    mom-n/v   GERD (gastroesophageal reflux disease)    History of kidney stones    Hyperlipidemia    Hypertension    Hypothyroidism    Seizure (HCC)    as an infant x2     Surgical History: Past Surgical History:  Procedure Laterality Date   CARDIAC SURGERY     CHOLECYSTECTOMY     MITRAL VALVE REPAIR  09/15/2013   PACEMAKER IMPLANT  2015   TRANSESOPHAGEAL ECHOCARDIOGRAPHY      Home Medications:  Allergies as of 11/09/2023       Reactions   Erythromycin Base Nausea And Vomiting   Pt states she can take this medication, but causes severe GI upset   Penicillins Nausea And Vomiting, Rash        Medication List        Accurate as of Nov 09, 2023  9:22 AM. If you have any questions, ask your nurse or doctor.          STOP taking these medications    losartan 25 MG tablet Commonly known as: COZAAR       TAKE these medications    aspirin EC 81 MG tablet Take 81 mg by mouth daily.   carvedilol 12.5 MG tablet Commonly known as: COREG Take 12.5 mg by mouth 2 (two) times daily.   Entresto 24-26 MG Generic drug:  sacubitril-valsartan SMARTSIG:1 Tablet(s) By Mouth Every 12 Hours   furosemide 20 MG tablet Commonly known as: LASIX Take 20 mg by mouth daily as needed for fluid or edema.   ibuprofen  800 MG tablet Commonly known as: ADVIL  Take 1 tablet (800 mg total) by mouth every 8 (eight) hours as needed.   levothyroxine 50 MCG tablet Commonly known as: SYNTHROID Take 50 mcg by mouth daily before breakfast.   multivitamin with minerals Tabs tablet Take 1 tablet by mouth daily.   potassium chloride 10 MEQ tablet Commonly known as: KLOR-CON Take 10 mEq by mouth daily.   potassium chloride SA 20 MEQ tablet Commonly known as: KLOR-CON M Take 20 mEq by mouth daily.   simvastatin 20 MG tablet Commonly known as: ZOCOR Take 20 mg by mouth at bedtime.   Trospium  Chloride 60 MG Cp24 Take 1 capsule (60 mg total) by mouth daily.        Allergies:  Allergies  Allergen Reactions   Erythromycin Base Nausea And Vomiting    Pt states she can take this medication, but causes severe GI upset    Penicillins Nausea And Vomiting and Rash    Family  History: Family History  Problem Relation Age of Onset   Heart disease Mother     Social History:   reports that she has never smoked. She has never used smokeless tobacco. She reports that she does not drink alcohol and does not use drugs.  Physical Exam: BP 102/68   Pulse 79   Ht 5\' 1"  (1.549 m)   Wt 138 lb (62.6 kg)   BMI 26.07 kg/m   Constitutional:  Alert and oriented, no acute distress, nontoxic appearing HEENT: [place], AT Cardiovascular: No clubbing, cyanosis, or edema Respiratory: Normal respiratory effort, no increased work of breathing Skin: No rashes, bruises or suspicious lesions Neurologic: Grossly intact, no focal deficits, moving all 4 extremities Psychiatric: Normal mood and affect  Laboratory Data: Results for orders placed or performed in visit on 09/24/23  BLADDER SCAN AMB NON-IMAGING   Collection Time: 09/24/23  9:27  AM  Result Value Ref Range   Scan Result 1ml    Assessment & Plan:   1. OAB (overactive bladder) Increased symptom control on trospium  without anticholinergic side effects.  She is emptying appropriately.  Will continue this and see her back next year. - Trospium  Chloride 60 MG CP24; Take 1 capsule (60 mg total) by mouth daily.  Dispense: 30 capsule; Refill: 11   Return in about 1 year (around 11/08/2024) for Annual OAB f/u with PVR.  Kathreen Pare, PA-C  [facility] [place] [address] [place], [place] [address] [phone]

## 2024-01-16 ENCOUNTER — Other Ambulatory Visit: Payer: Self-pay | Admitting: Gastroenterology

## 2024-01-16 ENCOUNTER — Encounter: Payer: Self-pay | Admitting: Gastroenterology

## 2024-01-16 DIAGNOSIS — R634 Abnormal weight loss: Secondary | ICD-10-CM

## 2024-01-16 DIAGNOSIS — R1032 Left lower quadrant pain: Secondary | ICD-10-CM

## 2024-01-21 ENCOUNTER — Ambulatory Visit
Admission: RE | Admit: 2024-01-21 | Discharge: 2024-01-21 | Disposition: A | Discharge status: Home / Self Care | Source: Ambulatory Visit | Attending: Gastroenterology

## 2024-01-21 DIAGNOSIS — R1032 Left lower quadrant pain: Secondary | ICD-10-CM

## 2024-01-21 DIAGNOSIS — R634 Abnormal weight loss: Secondary | ICD-10-CM

## 2024-01-21 MED ORDER — IOPAMIDOL (ISOVUE-300) INJECTION 61%
100.0000 mL | Freq: Once | INTRAVENOUS | Status: AC | PRN
  Administered 2024-01-21: 100 mL via INTRAVENOUS

## 2024-04-08 ENCOUNTER — Other Ambulatory Visit: Payer: Self-pay | Admitting: Physician Assistant

## 2024-04-08 DIAGNOSIS — N3281 Overactive bladder: Secondary | ICD-10-CM

## 2024-08-25 ENCOUNTER — Ambulatory Visit (HOSPITAL_COMMUNITY): Admitting: Physical Therapy

## 2024-09-10 ENCOUNTER — Ambulatory Visit (HOSPITAL_COMMUNITY): Admitting: Physical Therapy

## 2024-11-07 ENCOUNTER — Ambulatory Visit: Admitting: Physician Assistant
